# Patient Record
Sex: Female | Born: 1970
Health system: Southern US, Community
[De-identification: ages and names within clinical notes are randomized; demographics above are authoritative.]

## PROBLEM LIST (undated history)

## (undated) DIAGNOSIS — J45909 Unspecified asthma, uncomplicated: Secondary | ICD-10-CM

## (undated) DIAGNOSIS — Z8669 Personal history of other diseases of the nervous system and sense organs: Secondary | ICD-10-CM

## (undated) DIAGNOSIS — G4733 Obstructive sleep apnea (adult) (pediatric): Secondary | ICD-10-CM

## (undated) DIAGNOSIS — Z8679 Personal history of other diseases of the circulatory system: Secondary | ICD-10-CM

## (undated) DIAGNOSIS — I509 Heart failure, unspecified: Secondary | ICD-10-CM

## (undated) DIAGNOSIS — K219 Gastro-esophageal reflux disease without esophagitis: Secondary | ICD-10-CM

## (undated) DIAGNOSIS — I10 Essential (primary) hypertension: Secondary | ICD-10-CM

## (undated) DIAGNOSIS — M797 Fibromyalgia: Secondary | ICD-10-CM

## (undated) HISTORY — PX: LIPOSUCTION: SHX10

## (undated) HISTORY — PX: HUMERUS FRACTURE SURGERY: SHX670

## (undated) HISTORY — PX: OTHER SURGICAL HISTORY: SHX169

## (undated) HISTORY — PX: TONSILLECTOMY AND ADENOIDECTOMY: SUR1326

## (undated) HISTORY — DX: Unspecified asthma, uncomplicated: J45.909

## (undated) HISTORY — PX: TYMPANOTOMY: SHX2588

## (undated) HISTORY — DX: Personal history of other diseases of the circulatory system: Z86.79

## (undated) HISTORY — DX: Essential (primary) hypertension: I10

## (undated) HISTORY — DX: Fibromyalgia: M79.7

## (undated) HISTORY — DX: Obstructive sleep apnea (adult) (pediatric): G47.33

## (undated) HISTORY — DX: Heart failure, unspecified: I50.9

---

## 1998-06-22 ENCOUNTER — Other Ambulatory Visit: Admission: RE | Admit: 1998-06-22 | Discharge: 1998-06-22 | Payer: Self-pay | Admitting: Obstetrics and Gynecology

## 1998-08-18 ENCOUNTER — Ambulatory Visit (HOSPITAL_COMMUNITY): Admission: RE | Admit: 1998-08-18 | Discharge: 1998-08-18 | Payer: Self-pay | Admitting: Obstetrics and Gynecology

## 1998-08-18 ENCOUNTER — Encounter: Payer: Self-pay | Admitting: Obstetrics and Gynecology

## 1999-05-23 ENCOUNTER — Ambulatory Visit (HOSPITAL_COMMUNITY): Admission: RE | Admit: 1999-05-23 | Discharge: 1999-05-23 | Payer: Self-pay | Admitting: Obstetrics and Gynecology

## 1999-05-23 ENCOUNTER — Encounter: Payer: Self-pay | Admitting: Obstetrics and Gynecology

## 1999-12-01 ENCOUNTER — Other Ambulatory Visit: Admission: RE | Admit: 1999-12-01 | Discharge: 1999-12-01 | Payer: Self-pay | Admitting: Obstetrics and Gynecology

## 2000-01-06 ENCOUNTER — Ambulatory Visit (HOSPITAL_COMMUNITY): Admission: RE | Admit: 2000-01-06 | Discharge: 2000-01-06 | Payer: Self-pay | Admitting: Obstetrics and Gynecology

## 2000-11-02 ENCOUNTER — Other Ambulatory Visit: Admission: RE | Admit: 2000-11-02 | Discharge: 2000-11-02 | Payer: Self-pay | Admitting: Obstetrics and Gynecology

## 2001-01-16 ENCOUNTER — Encounter: Payer: Self-pay | Admitting: Emergency Medicine

## 2001-01-16 ENCOUNTER — Emergency Department (HOSPITAL_COMMUNITY): Admission: EM | Admit: 2001-01-16 | Discharge: 2001-01-16 | Payer: Self-pay | Admitting: Emergency Medicine

## 2001-01-17 ENCOUNTER — Encounter: Payer: Self-pay | Admitting: Emergency Medicine

## 2001-12-18 ENCOUNTER — Other Ambulatory Visit: Admission: RE | Admit: 2001-12-18 | Discharge: 2001-12-18 | Payer: Self-pay | Admitting: Obstetrics and Gynecology

## 2002-10-03 ENCOUNTER — Ambulatory Visit (HOSPITAL_COMMUNITY): Admission: RE | Admit: 2002-10-03 | Discharge: 2002-10-03 | Payer: Self-pay | Admitting: Gastroenterology

## 2002-10-03 ENCOUNTER — Encounter (INDEPENDENT_AMBULATORY_CARE_PROVIDER_SITE_OTHER): Payer: Self-pay | Admitting: Specialist

## 2002-10-10 ENCOUNTER — Ambulatory Visit (HOSPITAL_COMMUNITY): Admission: RE | Admit: 2002-10-10 | Discharge: 2002-10-10 | Payer: Self-pay | Admitting: Gastroenterology

## 2002-10-10 ENCOUNTER — Encounter: Payer: Self-pay | Admitting: Gastroenterology

## 2002-10-17 ENCOUNTER — Encounter: Payer: Self-pay | Admitting: Gastroenterology

## 2002-10-17 ENCOUNTER — Encounter: Admission: RE | Admit: 2002-10-17 | Discharge: 2002-10-17 | Payer: Self-pay | Admitting: Gastroenterology

## 2003-01-15 ENCOUNTER — Other Ambulatory Visit: Admission: RE | Admit: 2003-01-15 | Discharge: 2003-01-15 | Payer: Self-pay | Admitting: Obstetrics and Gynecology

## 2003-02-06 ENCOUNTER — Ambulatory Visit (HOSPITAL_BASED_OUTPATIENT_CLINIC_OR_DEPARTMENT_OTHER): Admission: RE | Admit: 2003-02-06 | Discharge: 2003-02-06 | Payer: Self-pay | Admitting: Obstetrics and Gynecology

## 2004-03-04 ENCOUNTER — Other Ambulatory Visit: Admission: RE | Admit: 2004-03-04 | Discharge: 2004-03-04 | Payer: Self-pay | Admitting: Obstetrics and Gynecology

## 2004-10-31 ENCOUNTER — Ambulatory Visit: Payer: Self-pay | Admitting: Internal Medicine

## 2004-10-31 ENCOUNTER — Ambulatory Visit (HOSPITAL_BASED_OUTPATIENT_CLINIC_OR_DEPARTMENT_OTHER): Admission: RE | Admit: 2004-10-31 | Discharge: 2004-10-31 | Payer: Self-pay | Admitting: Otolaryngology

## 2005-03-14 ENCOUNTER — Other Ambulatory Visit: Admission: RE | Admit: 2005-03-14 | Discharge: 2005-03-14 | Payer: Self-pay | Admitting: Obstetrics and Gynecology

## 2006-05-17 ENCOUNTER — Other Ambulatory Visit: Admission: RE | Admit: 2006-05-17 | Discharge: 2006-05-17 | Payer: Self-pay | Admitting: Obstetrics and Gynecology

## 2006-05-20 ENCOUNTER — Emergency Department (HOSPITAL_COMMUNITY): Admission: EM | Admit: 2006-05-20 | Discharge: 2006-05-20 | Payer: Self-pay | Admitting: Emergency Medicine

## 2006-11-19 ENCOUNTER — Encounter (INDEPENDENT_AMBULATORY_CARE_PROVIDER_SITE_OTHER): Payer: Self-pay | Admitting: *Deleted

## 2006-11-19 ENCOUNTER — Inpatient Hospital Stay (HOSPITAL_COMMUNITY): Admission: RE | Admit: 2006-11-19 | Discharge: 2006-11-21 | Payer: Self-pay | Admitting: Obstetrics and Gynecology

## 2006-11-24 ENCOUNTER — Observation Stay (HOSPITAL_COMMUNITY): Admission: AD | Admit: 2006-11-24 | Discharge: 2006-11-25 | Payer: Self-pay | Admitting: Obstetrics and Gynecology

## 2009-08-13 ENCOUNTER — Encounter: Admission: RE | Admit: 2009-08-13 | Discharge: 2009-08-13 | Payer: Self-pay | Admitting: Occupational Medicine

## 2010-05-17 ENCOUNTER — Encounter (INDEPENDENT_AMBULATORY_CARE_PROVIDER_SITE_OTHER): Payer: Self-pay | Admitting: Cardiovascular Disease

## 2010-05-17 ENCOUNTER — Ambulatory Visit (HOSPITAL_COMMUNITY): Admission: RE | Admit: 2010-05-17 | Discharge: 2010-05-17 | Payer: Self-pay | Admitting: Cardiovascular Disease

## 2010-06-29 ENCOUNTER — Encounter (INDEPENDENT_AMBULATORY_CARE_PROVIDER_SITE_OTHER): Payer: Self-pay | Admitting: Cardiovascular Disease

## 2010-06-29 ENCOUNTER — Ambulatory Visit (HOSPITAL_COMMUNITY): Admission: RE | Admit: 2010-06-29 | Discharge: 2010-06-29 | Payer: Self-pay | Admitting: Cardiovascular Disease

## 2011-01-20 NOTE — Op Note (Signed)
   NAME:  [name], [name]                         ACCOUNT NO.:  [id number]   MEDICAL RECORD NO.:  [id number]                   PATIENT TYPE:  AMB   LOCATION:  ENDO                                 FACILITY:   PHYSICIAN:  Anselmo Rod, M.D.               DATE OF BIRTH:  [birth year]   DATE OF PROCEDURE:  10/03/2002  DATE OF DISCHARGE:                                 OPERATIVE REPORT   PROCEDURE:  Colonoscopy with biopsy endoscopy.   INSTRUMENT USED:  Pediatric adjustable Olympus colonoscope.   INDICATIONS FOR PROCEDURE:  Ongoing diarrhea with mucoid stool, right lower  quadrant pain and guaiac positive stools.  A 40 year old white female rule  out IBD.   PROCEDURE PREPARATION:  Informed consent was secured from the patient.  The  patient had fasted for eight hours prior to the procedure and prepped with a  bottle of MiraLax the night prior to the procedure.   PREPROCEDURE PHYSICAL:  The patient has stable vital signs.  Neck supple.  Chest clear to auscultation.  Heart S1, S2, regular.  Abdomen soft with  normal bowel sounds.   DESCRIPTION OF PROCEDURE:  The patient was placed in the left lateral  decubitus position and sedated with 70 mg of Demerol and 8 mg of Versed  intravenously.  Once the patient was adequately sedated she was maintained  on low-flow oxygen and continuous cardiac monitoring.  The Olympus video  colonoscope was advanced from the rectum to the cecum and terminal ileum  without difficulty.  The patient had a fairly good prep.  No masses, polyps,  erosions, ulcerations or diverticula were seen.  Retroflexion in the rectum  revealed no acute abnormalities.  There was patchy erosion seen in the  terminal ileum.  These were biopsied to rule out Crohn's.   IMPRESSION:  1. Healthy appearing colon including rectum, left colon, right colon,     transverse colon and cecum.  2. Multiple erosions in the terminal ileum, biopsies to rule out Crohn's     disease.   RECOMMENDATIONS:  1. Await pathology results.  Avoid all nonsteroidals including aspirin for     now.  2. Outpatient followup in the next one week for further recommendation.                                               Anselmo Rod, M.D.    JNM/MEDQ  D:  10/03/2002  T:  10/04/2002  Job:  [id number]

## 2011-01-20 NOTE — Discharge Summary (Signed)
NAMEDENYS, LABREE NO.:  [id number]   MEDICAL RECORD NO.:  [id number]          PATIENT TYPE:  OBV   LOCATION:  9317                          FACILITY:  [facility]   PHYSICIAN:  Malachi Pro. Ambrose Mantle, M.D. DATE OF BIRTH:  1971/04/09   DATE OF ADMISSION:  11/24/2006  DATE OF DISCHARGE:  11/25/2006                               DISCHARGE SUMMARY   This is a 40 year old white female 5 days status post C-section for  breech presentation seen in the emergency room with heavy vaginal  bleeding. The patient's history and physical exam are present in the  history and physical.  She was admitted after manually expressing clots  from the uterus and giving her Hemabate, Pitocin and Methergine. After  admission the patient remained afebrile.  Vital signs were stable.  She  tolerated ambulation without difficulty, tolerated a regular diet, had  no more significant bleeding and on the first day after admission was  ready for discharge. Prior to discharge I did massage her uterus to see  if I could express any clots. I examined her vagina.  There were no  clots in the vagina.  Cervix was a fingertip dilated and I could feel  some clot behind the cervix but only being able to put one finger in,  was again unable to remove the clots. The patient is ambulating well.  She has no significant bleeding and she is ready for discharge.   LABORATORY DATA ON ADMISSION:  Hemoglobin 9.5, hematocrit 27.7, white  count 9400, platelet count 361,000.  On today's hemoglobin is 7.5,  hematocrit 22.2, white count 6700, platelet count 349,000.   I feel that the drop in hemoglobin is just equilibration after the blood  loss she experienced 24 hours prior. She has had no significant bleeding  since then and my examination confirms that there is no blood in the  vagina and no additional blood inside the uterus.   FINAL DIAGNOSES:  Postpartum hemorrhage.   OPERATION:  None.   FINAL CONDITION:  Improved.   INSTRUCTIONS:  Include the same as on our printed instructions at the  time of her discharge earlier. Call with any heavy bleeding or any  significant problems. Methergine 0.2 mg p.o. q.6 hours for 3 days.  Continue the Percocet she was given at discharge as required and return  to the office in about a week for follow-up examination.      Malachi Pro. Ambrose Mantle, M.D.  Electronically Signed     TFH/MEDQ  D:  11/25/2006  T:  11/25/2006  Job:  [id number]

## 2011-01-20 NOTE — Procedures (Signed)
NAME:  [name], [name] NO.:  [id number]   MEDICAL RECORD NO.:  [id number]          PATIENT TYPE:  OUT   LOCATION:  SLEEP CENTER                 FACILITY:  [facility]   PHYSICIAN:  Clinton D. Maple Hudson, M.D. DATE OF BIRTH:  [birth year]   DATE OF STUDY:  10/31/2004                              NOCTURNAL POLYSOMNOGRAM   DATE OF STUDY:  October 31, 2004   REFERRING PHYSICIAN:  Dr. Lucky Cowboy   INDICATION FOR STUDY:  Insomnia with sleep apnea.  Epworth Sleepiness Score  7/24, BMI 29, weight 164 pounds.   SLEEP ARCHITECTURE:  Total sleep time 365 minutes with sleep efficiency 74%.  Stage I was 13%, stage II 49%, stages III and IV 12%, REM was 27% of total  sleep time.  Sleep latency 27 minutes, REM latency 89 minutes, awake after  sleep onset 105 minutes, arousal index 27.9.  She used Breathe Right nasal  strips but no sleep medications.   RESPIRATORY DATA:  Split-study protocol.  Respiratory disturbance index  (RDI) 18.5 obstructive events per hour indicating mild to moderate  obstructive sleep apnea/hypopnea syndrome before CPAP.  This included 1  obstructive apnea and 44 hypopneas before CPAP.  Events were not positional.  REM RDI 14.1.  CPAP was titrated to 6 CWP, RDI 3.8 per hour using a petite  Respironics ComfortGel Nasal Mask with heated humidifier.   OXYGEN DATA:  Moderate snoring with oxygen desaturation to a nadir of 87%.  Mean oxygen saturation through the study was 98%, including on CPAP.   CARDIAC DATA:  Normal sinus rhythm with frequent PVCs.   MOVEMENT/PARASOMNIA:  A total of 93 limb jerks were recorded of which 7 were  associated with arousal or awakening for a periodic limb movement with  arousal index of 1.2 per hour which is probably not significant.   IMPRESSION/RECOMMENDATION:  1.  Mild to moderate obstructive sleep apnea/hypopnea syndrome, respiratory      disturbance index 18.5 per hour with oxygen desaturation to 87%.  2.  Successful continuous  positive airway pressure titration to 6 CWP,      respiratory disturbance index 3.8 per hour using a petite Respironics      ComfortGel Nasal Mask with heated humidifier and Breathe Right nasal      strips.      CDY/MEDQ  D:  11/06/2004 12:02:20  T:  11/06/2004 18:54:19  Job:  [id number]

## 2011-01-20 NOTE — H&P (Signed)
[name], [name] NO.:  [id number]   MEDICAL RECORD NO.:  [id number]          PATIENT TYPE:  OBV   LOCATION:  9317                          FACILITY:  [facility]   PHYSICIAN:  Malachi Pro. Ambrose Mantle, M.D. DATE OF BIRTH:  Nov 25, 1970   DATE OF ADMISSION:  11/24/2006  DATE OF DISCHARGE:                              HISTORY & PHYSICAL   This is a 40 year old white female five days status post C-section seen  with heavy vaginal bleeding. The patient had a C-section for breech  presentation on November 19, 2006. The cervix never became more than a  fingertip dilated. She was discharged on the second postop day and came  to our office for staple removal on November 22, 2006. After having no  significant problems on the a.m. of admission she passed a huge clot  and then another and soaked several towels.  She came here for  evaluation.   ALLERGIES:  No known drug allergies. She is allergic to CONTRAST  MATERIAL WITH IODINE.   ILLNESSES:  Asthma that did not require hospitalization. She has had  sleep apnea.   OPERATIONS:  Laparoscopic surgery x2 for endometriosis, tonsillectomy  and adenoidectomy, liposuction, and a pin in her arm.   PHYSICAL EXAMINATION:  VITAL SIGNS:  On admission her vital signs varied  somewhat. Initially the pulse was 129 with a blood pressure 121/91, but  subsequent pulses were 108, 91, and 102. All blood pressures remained in  the normal range. Respirations 20, temperature 98.  GENERAL:  The patient was a well-developed, somewhat obese white female,  apprehensive but in no other distress.  HEART:  Normal size and sounds. No murmurs.  LUNGS:  Clear to auscultation.  ABDOMEN:  Soft. The Steri-Strips were stained with blood, but the  incision itself was not bleeding.  LUNGS:  Clear to auscultation.  GENITOURINARY:  Uterus was firm but with palpation blood clots were  expressed from the vagina. Speculum exam reveals some bleeding through  the cervical os.  The cervix admitted only one finger and some clots  could still be felt in the uterus, but with only one finger I could not  remove them. The patient was placed on an IV with Pitocin. Was given an  injection of Hemabate and with massage and the Pitocin and Hemabate the  uterus contracted well and the bleeding diminished. We did measure 100  cc of blood clot that were evacuated. The patient states that she passed  the same amount of clot at home on two occasions and also saturated five  towels.   ADMITTING IMPRESSION:  Postpartum hemorrhage. The patient is admitted  for observation and to be given IV Pitocin, Mepergan, and to be closely  monitored to see if surgical intervention is necessary.      Malachi Pro. Ambrose Mantle, M.D.  Electronically Signed     TFH/MEDQ  D:  11/24/2006  T:  11/24/2006  Job:  [id number]

## 2011-01-20 NOTE — Op Note (Signed)
NAME:  [name], [name]                         ACCOUNT NO.:  [id number]   MEDICAL RECORD NO.:  [id number]                   PATIENT TYPE:  AMB   LOCATION:  [facility]                                 FACILITY:  [facility]   PHYSICIAN:  Daniel L. Eda Paschal, M.D.           DATE OF BIRTH:  [birth year]   DATE OF PROCEDURE:  02/06/2003  DATE OF DISCHARGE:                                 OPERATIVE REPORT   PREOPERATIVE DIAGNOSES:  Pelvic pain, dyspareunia, endometriosis strongly  suspected.   POSTOPERATIVE DIAGNOSES:  Pelvic pain, dyspareunia, endometriosis.   OPERATION:  Laparoscopy with laser excision of endometriosis.   SURGEON:  Daniel L. Eda Paschal, M.D.   ANESTHESIA:  General endotracheal.   INDICATIONS FOR PROCEDURE:  The patient is a 40 year old female who  presented to the office with right lower quadrant pain. She was also having  generalized pelvic pain, she is having severe dyspareunia. She is having  bleeding with intercourse. She has a family history of endometriosis and as  a result of all the above, she now enters the hospital for laparoscopy and  appropriate surgery. She has been evaluated by Dr. Elsie Amis who thought she  might have inflammatory bowel disease but after a thorough evaluation was  diagnosed with just irritable bowel.   FINDINGS:  At the time of the surgery, the patient had two areas of  extensive endometriosis, one was in the cul-de-sac between the two  uterosacral ligaments. It consisted of mostly nonpigment although there was  also some pigmented endometriosis. There was also some vascular pigmented  endometriosis. Total area involved was about 4-5 cm. It was somewhere  between superficial and deep. The right ovary and fallopian tube were  normal. There were no adhesions involving the right ovary or tubes, the  fimbria on the right were luxuriant. The left ovary had extensive  endometriosis as well. It was mostly nonpigmented, it involved some surface  ovarian  adhesions although the ovary was not adherent to surrounding  structures. Total area involved was approximately 4 cm. This was all  superficial. There were also two very small simple cysts that drained clear  fluid when they were lasered. The left fallopian tube was normal with  luxuriant fimbria. The uterus had a very small fibroid of about 1 1/2 cm on  the top of the fundus which was subserosal. The vesicouterine fold to  peritoneum was free of any disease, there was no other adhesive diseases  except as described above. The ileocecal junction was identified, appendix  was normal. The right upper quadrant was visualized and was normal. Indigo  carmine was introduced and both tubes were patent without fimbrial bridging.   DESCRIPTION OF PROCEDURE:  After adequate general endotracheal anesthesia,  the patient was placed in the dorsal lithotomy position, prepped and draped  in the usual sterile manner. A Hulka catheter was placed into the uterus, a  pneumoperitoneum was created with a Veress needle,  placed subumbilically. 3  1/2 liters of carbon dioxide were utilized to create a pneumoperitoneum. The  10 mm trocar was then placed through the subumbilical incision and the  operating laparoscope was placed through the trocar. Under direct vision, a  5 mm port was placed suprapubically, the bladder had been emptied with a  Robinson catheter prior to any operative procedure. The pelvis was  visualized which is noted above. Using a neodymium YAG laser with a frosted  GR4 tip over the bare fiber at power settings of 4, all endometriosis could  be laser vaporized and excised. Before this was done, care was taken to  identify both ureters so that they would not be involved in dissection. The  entire cul-de-sac area, the entire left ovary area were all completely  excised without in any way compromising the ureter. It was a very large  vessel in the midline in the cul-de-sac that was encountered  during the  beginning of the dissection and bipolar coagulation was utilized in order to  stop the bleeding but this was successful. Total time of laser was probably  40 minutes. When this had been completed, copious irrigation was done with  Ringer's lactate and all cul-de-sac fluid was removed. Indigo carmine was  chemotubated through the cervix and both tubes were patent. All cul-de-sac  fluid was removed, pneumoperitoneum was evacuated. The subumbilical fascial  incision was closed with #0 Vicryl and the two skin incisions closed with 3-  0 Monocryl. The patient tolerated the procedure well and left the operating  room in satisfactory condition.                                               Daniel L. Eda Paschal, M.D.    Tonette Bihari  D:  02/06/2003  T:  02/06/2003  Job:  [id number]

## 2011-01-20 NOTE — Op Note (Signed)
[facility]  Patient:    [name], [name]                      MRN: [id number] Proc. Date: 01/06/00 Adm. Date:  [id number] Disc. Date: [id number] Attending:  Michaele Offer CC:         Morrison Old, M.D., [facility]                           Operative Report  PREOPERATIVE DIAGNOSES:  Pelvic pain, dyspareunia.  POSTOPERATIVE DIAGNOSES:  Pelvic pain, dyspareunia, pelvic adhesion and uterine leiomyoma.  OPERATION: Laparoscopic adhesiolysis and chromopertubation. SURGEON: Zenaida Niece, M.D.  ANESTHESIA: General endotracheal tube.  ESTIMATED BLOOD LOSS: Less than 50 cc.  FINDINGS:  She has a normal-appearing liver edge, gallbladder, upper abdomen, and appendix.  Uterus was normal in size and had a small fundal myoma. Anterior cul-de-sac was normal.  Both tubes and ovaries appeared normal. Posterior cul-de-sac was normal except for one peritoneal followed/adhesion. There was no evidence of endometriosis or significant pelvic adhesions.  On chromopertubation both fallopian tubes were easily patent and the fimbria of both tubes were very mildly clubbed but no significant abnormalities.  COUNTS: Correct.  CONDITION: Stable.  DESCRIPTION OF PROCEDURE:  After appropriate informed consent was obtained, the patient was taken to the operating room and placed in the dorsal supine position.  General anesthesia was induced and she was placed in the mobile stirrups.  Her abdomen was prepped and draped in the usual sterile fashion for laparoscopic procedure, her bladder was drained with a red rubber catheter, and a hemiuterine manipulator was inserted through the cervix into the uterus for uterine manipulation.  Her infraumbilical skin was then infiltrated with 0.25% Marcaine and a 1.5 cm horizontal incision was made.  The 10/11 disposable trocar was then introduced and placement confirmed by the laparoscope after CO2 gas was insufflated.   A 5 mm port was placed in the midline 3 cm above the pubis symphysis under direct visualization.  Inspection revealed the above-mentioned findings.  The adhesion in the posterior cul-de-sac was cut sharply and bleeding controlled with bipolar cautery. Prior to this chromopertubation had been performed with methylene blue through the hemiuterine manipulator.  Both tubes were easily patent.  The pelvis was irrigated and all irrigant was suctioned out.  The adhesion site of the posterior cul-de-sac was found to have adequate hemostasis.  At this point there was no other evidence of adhesions and no other source for her pelvic pain discovered, so the procedure was terminated.  The 5 mm port was removed under direct visualization.  All gas was allowed to deflate from the abdomen and the 10/11 trocar was then also removed.  The skin incisions were closed with interrupted subcuticular sutures of 4-0 Vicryl.  The hemiuterine manipulator was then removed.  The patient was extubated in the operating room, tolerated the procedure well, and taken to the recovery room in stable condition. DD:  01/06/00 TD:  01/09/00 Job: [id number]

## 2011-01-20 NOTE — H&P (Signed)
NAMEJYLA, [name] NO.:  [id number]   MEDICAL RECORD NO.:  [id number]          PATIENT TYPE:  INP   LOCATION:  9160                          FACILITY:  [facility]   PHYSICIAN:  Malachi Pro. Ambrose Mantle, M.D. DATE OF BIRTH:  10-06-1970   DATE OF ADMISSION:  11/19/2006  DATE OF DISCHARGE:                              HISTORY & PHYSICAL   PRESENT ILLNESS:  A 40 year old white female, para 0-0-1-0, gravida 2,  at 38 weeks and zero days, admitted to the hospital after having  spontaneous rupture of membranes at 4 a.m. with clear fluid.  The  patient came to the hospital and was thought to have a negative  Nitrazine test but then while she was in the maternity admission unit  she had a spontaneous gush of fluid.  She had good fetal movement.  No  vaginal bleeding.  She had a few contractions every 10 minutes.  She had  no history of headaches or blurred vision.  She had some right upper  quadrant pain and had increased swelling.   Blood group and type AB positive with negative antibody.  GC and  Chlamydia negative.  RPR nonreactive.  Rubella immune.  hepatitis B  surface antigen negative.  HIV negative.  Glucola 116.  Group B strep  negative.  Cystic fibrosis negative.   PAST MEDICAL HISTORY:  1. Reveals no known drug allergies.  2. She had a history of asthma without hospitalization.  3. She had sleep apnea.   SURGICAL HISTORY:  1. Laparoscopic surgery for endometriosis x2.  2. Tonsillectomy and adenoidectomy.  3. Liposuction.  4. Left arm was broken and had a pin inserted.   1. She had no past obstetrical history except for one spontaneous      abortion.  2. She had no history of abdomen Pap smears.  3. No history of STDs.    She is married.   She takes Prevacid, Tylenol, and prenatal vitamins.   FAMILY HISTORY:  Positive for diabetes mellitus in the paternal  grandmother.  Positive for hypertension in the paternal grandmother.  Cancer of the lung in the paternal  grandfather.  Thyroid disease in a  sister.   PHYSICAL EXAMINATION:  VITAL SIGNS:  Normal, blood pressure is 156/82,  temperature 98, pulse 88, respirations 20.  GENERAL:  Well-developed, well-nourished, white female in no distress.  HEART:  Normal size, no murmurs.  LUNGS:  Clear to auscultation.  ABDOMEN:  Soft, term size fundus.  Fetal heart tones were in the 130s.  PELVIC:  Per Dr. Emeline Darling exam, the cervix was fingertip, 50%, vertex,  at a minus 2.   IMPRESSION:  Intrauterine pregnancy at 38 weeks with premature rupture  of the membranes.   The patient was admitted for augmentation of labor with Pitocin.  PIH  labs were normal.  I examined the patient at 12:33 p.m. and instead of  finding the baby vertex, the baby was breech, which I confirmed by  ultrasound with the fetal vertex in the right upper quadrant of the  maternal abdomen, the spine along the left side of the maternal abdomen,  so the baby was LST.  Cervix was a fingertip, 70%, the breech was at a  minus 2 to minus 3 station.  I called for a C-section.  There was no  room immediately available, so we scheduled a C-section for 4 p.m. on  November 19, 2006.      Malachi Pro. Ambrose Mantle, M.D.  Electronically Signed     TFH/MEDQ  D:  11/19/2006  T:  11/19/2006  Job:  [id number]

## 2011-01-20 NOTE — Op Note (Signed)
[name], [name] NO.:  [id number]   MEDICAL RECORD NO.:  [id number]          PATIENT TYPE:  INP   LOCATION:  9121                          FACILITY:  [facility]   PHYSICIAN:  Malachi Pro. Ambrose Mantle, M.D. DATE OF BIRTH:  [birth year]   DATE OF PROCEDURE:  11/19/2006  DATE OF DISCHARGE:                               OPERATIVE REPORT   PREOPERATIVE DIAGNOSIS:  Intrauterine pregnancy at 38 weeks, premature  rupture of membranes, breech presentation.   POSTOPERATIVE DIAGNOSIS:  Intrauterine pregnancy at 38 weeks, premature  rupture of membranes, breech presentation with small fibroid on the  fundus of the uterus, on the anterior surface of the uterus near the  fundus and was about 3 cm in diameter.   OPERATION:  Low-transverse cervical C-section.   OPERATOR:  Dr. Ambrose Mantle   ANESTHESIA:  Epidural anesthesia.   DESCRIPTION OF PROCEDURE:  The patient was brought to the operating room  and the epidural anesthetic was boosted. She was placed in left lateral  tilt position.  Fetal heart tones were normal by the monitor. The  monitor was removed. Foley catheter was indwelling. The abdomen was  prepped with Betadine solution and draped as a sterile field.  Anesthesia was confirmed with an Allis clamp pinching the lower abdomen  and a transverse incision was made and carried in layers through the  skin and subcutaneous tissue and fascia. Fascia was incised  transversely, separated from the rectus muscles superiorly and  inferiorly. Rectus muscle was completely adherent in the midline and the  midline was created.  The peritoneum was opened, incision was made  vertically and it was pulled laterally to try to increase exposure.  The  lower uterine segment was inspected and incision was made into the lower  uterine segment into the myometrium with the rest away into the amniotic  sac with my finger. I enlarged the incision transversely with the  bandage scissors.  The breech was  present, delivered the breech through  the incisional opening. Then with some fundal pressure delivered the  baby down to the shoulders, delivered the left arm first the right arm  second. Delivered the baby through the incisional opening. The mouth was  there so I just put my finger in the mouth and delivered the baby  without any difficulty.  The nose and pharynx was suctioned with bulb.  The cord was clamped and the infant was given to the neonatologist who  was in attendance.  It was Dr. Mikle Bosworth, she assigned a 7 pound 13 ounce  female infant Apgars of 9 at 1 and 9 at 5 minutes. The placenta did not  separate immediately so I went ahead and did a partial manual extraction  of the placenta even though the patient had consented to cord blood  collection. Inside of the uterus was inspected and found to be free of  any remaining products of conception. Inspecting the uterine incision  revealed that the right side of the uterine incision had actually  extended downward a little bit but I was able to reach the extent of the  incision, palpated it very  carefully to make sure it was still on the  uterus itself and then closed the uterus in two layers using running  locked suture of 0 Vicryl on the first layer, nonlocking suture of the  same material on the second layer. I started the suture both times in  the right uterine angle to try to be sure that I accomplished complete  hemostasis under good visualization. I then reapproximated the  peritoneum over the incision with a running 3-0 Vicryl, liberally  irrigated the pelvis, had good hemostasis.  Both tubes and ovaries  appeared normal.  The uterus appeared normal except the before mentioned  fibroid. I checked both gutters for blood, liberally irrigated,  confirmed hemostasis again and then closed the abdominal wall with  interrupted sutures of 0 Vicryl on the rectus muscle and peritoneum  where the peritoneum was present, closed the fascia  with two running  sutures of 0 Vicryl.  The subcu tissue with a running 3-0 Vicryl.  Skin  was closed with automatic staples. As I was preparing to place my  dressing on the incision.  I realized that the right side of the skin  was not holding the very well and the staples were not really doing  their job so I redid the right side of the incision with staples trying  to make sure that we had skin edge to skin edge. The patient seemed to  tolerate the procedure well.  Blood loss was estimated at 1000 mL.  Sponge and needle counts were correct.  She was returned to recovery in  satisfactory condition.  The urine was clear and yellow at the end of  the procedure.      Malachi Pro. Ambrose Mantle, M.D.  Electronically Signed     TFH/MEDQ  D:  11/19/2006  T:  11/19/2006  Job:  [id number]

## 2011-01-20 NOTE — Discharge Summary (Signed)
NAMEYMANI, PORCHER NO.:  [id number]   MEDICAL RECORD NO.:  [id number]          PATIENT TYPE:  INP   LOCATION:  9121                          FACILITY:  [facility]   PHYSICIAN:  Malachi Pro. Ambrose Mantle, M.D. DATE OF BIRTH:  [birth year]   DATE OF ADMISSION:  11/19/2006  DATE OF DISCHARGE:  11/21/2006                               DISCHARGE SUMMARY   A 40 year old white female, para 0-0-1-0 gravida 2 at 68 weeks'  gestation admitted to the hospital after having spontaneous rupture of  membranes at home at 4 a.m. with clear fluid.  The patient came to the  hospital, was evaluated in MAU and had spontaneous gush of fluid there.  She was having rare contractions.  She was admitted to the hospital and  placed on Pitocin but subsequent exam found the patient to have a breach  presentation, so she was prepared for C-section.  Blood group and type  AB positive with a negative antibody.  GC and Chlamydia negative, RPR  nonreactive, rubella immune, hepatitis B surface antigen negative, HIV  negative.  Glucola was 116, group B strep negative, cystic fibrosis  negative.  The patient underwent a low transverse cervical C-section for  breech presentation, LST.  She did well postoperatively.  On the second  postop day, she was voiding well, tolerating a regular diet, passing  flatus, had had bowel movements.  Incision was healing well.  Her  abdomen was soft and nontender.  She was ready for discharge.  The baby  was born with a problem with its hips and has been seen by orthopedics  and has been advised a harness for 2 months and then following 2 months,  use the harness at night only.   Laboratory data showed a normal urinalysis.  PIH panel was normal.  Initial hemoglobin was 12.6, white count 9900, hematocrit 36.2, platelet  count 305,000, RPR nonreactive.  Followup hemoglobin 11.1, hematocrit  32.2.  Estimated GFR was greater than 60.   FINAL DIAGNOSES:  1. Intrauterine pregnancy at 38  weeks.  2. Premature rupture of membranes.  3. Breech presentation.  4. Small fibroid on the fundus of the uterus.   OPERATION:  Low transverse cervical C-section.   FINAL CONDITION:  Improved.   INSTRUCTIONS:  Our regular discharge instruction booklet.  The patient  is advised to return to the office in 2 days to have her staples  removed.  Percocet 5/325, 30 tabs 1 q.4-6h. p.r.n. pain is given at  discharge.      Malachi Pro. Ambrose Mantle, M.D.  Electronically Signed     TFH/MEDQ  D:  11/21/2006  T:  11/21/2006  Job:  [id number]

## 2011-04-05 ENCOUNTER — Ambulatory Visit: Payer: Self-pay

## 2011-04-05 ENCOUNTER — Other Ambulatory Visit: Payer: Self-pay | Admitting: Occupational Medicine

## 2011-04-05 DIAGNOSIS — R05 Cough: Secondary | ICD-10-CM

## 2011-06-08 ENCOUNTER — Other Ambulatory Visit: Payer: Self-pay | Admitting: Obstetrics and Gynecology

## 2011-06-08 DIAGNOSIS — Z1231 Encounter for screening mammogram for malignant neoplasm of breast: Secondary | ICD-10-CM

## 2011-06-22 ENCOUNTER — Ambulatory Visit
Admission: RE | Admit: 2011-06-22 | Discharge: 2011-06-22 | Disposition: A | Discharge status: Home / Self Care | Payer: 59 | Source: Ambulatory Visit | Attending: Obstetrics and Gynecology | Admitting: Obstetrics and Gynecology

## 2011-06-22 DIAGNOSIS — Z1231 Encounter for screening mammogram for malignant neoplasm of breast: Secondary | ICD-10-CM

## 2011-08-21 ENCOUNTER — Ambulatory Visit: Payer: Self-pay

## 2011-08-21 ENCOUNTER — Other Ambulatory Visit: Payer: Self-pay | Admitting: Occupational Medicine

## 2012-01-27 IMAGING — CR DG CHEST 2V
2 series · 2 of 2 positions shown · non-contrast
Comparison: None.

CLINICAL DATA: Persistent cough.

CHEST - 2 VIEW

[view not recorded (1 of 2)]
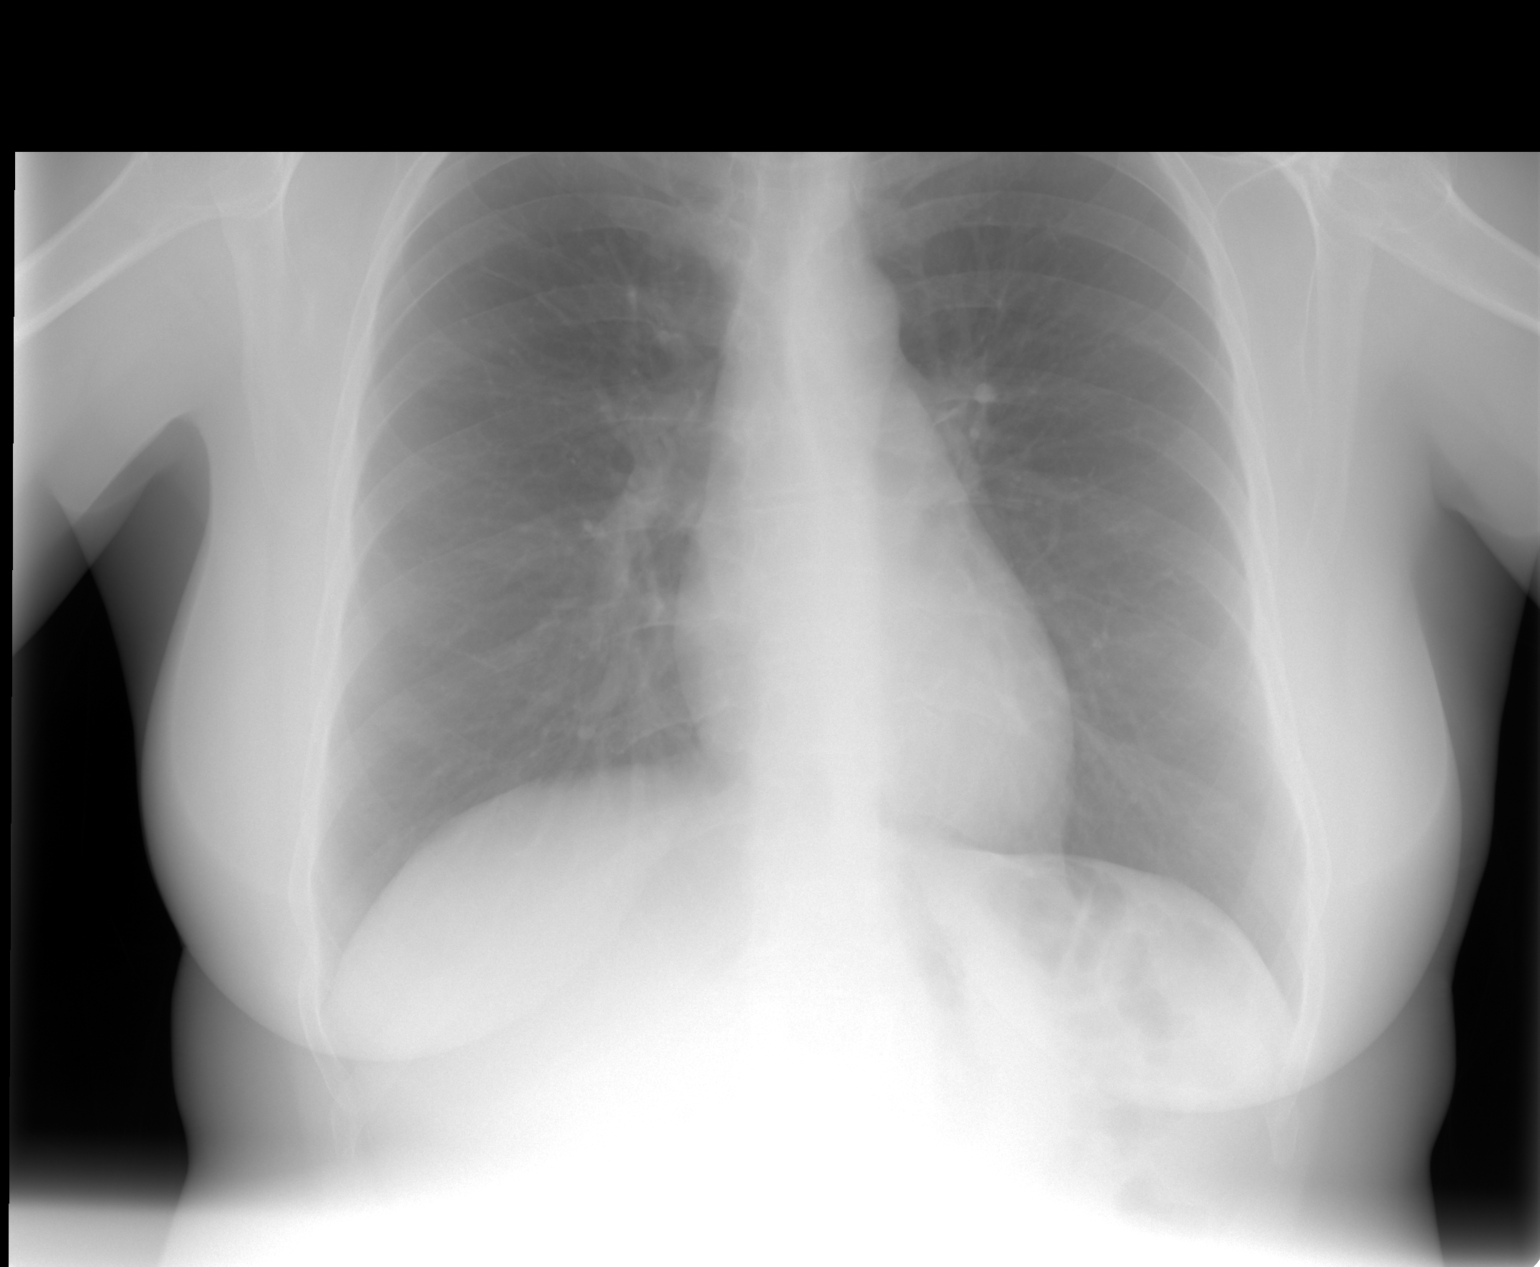

[view not recorded (2 of 2)]
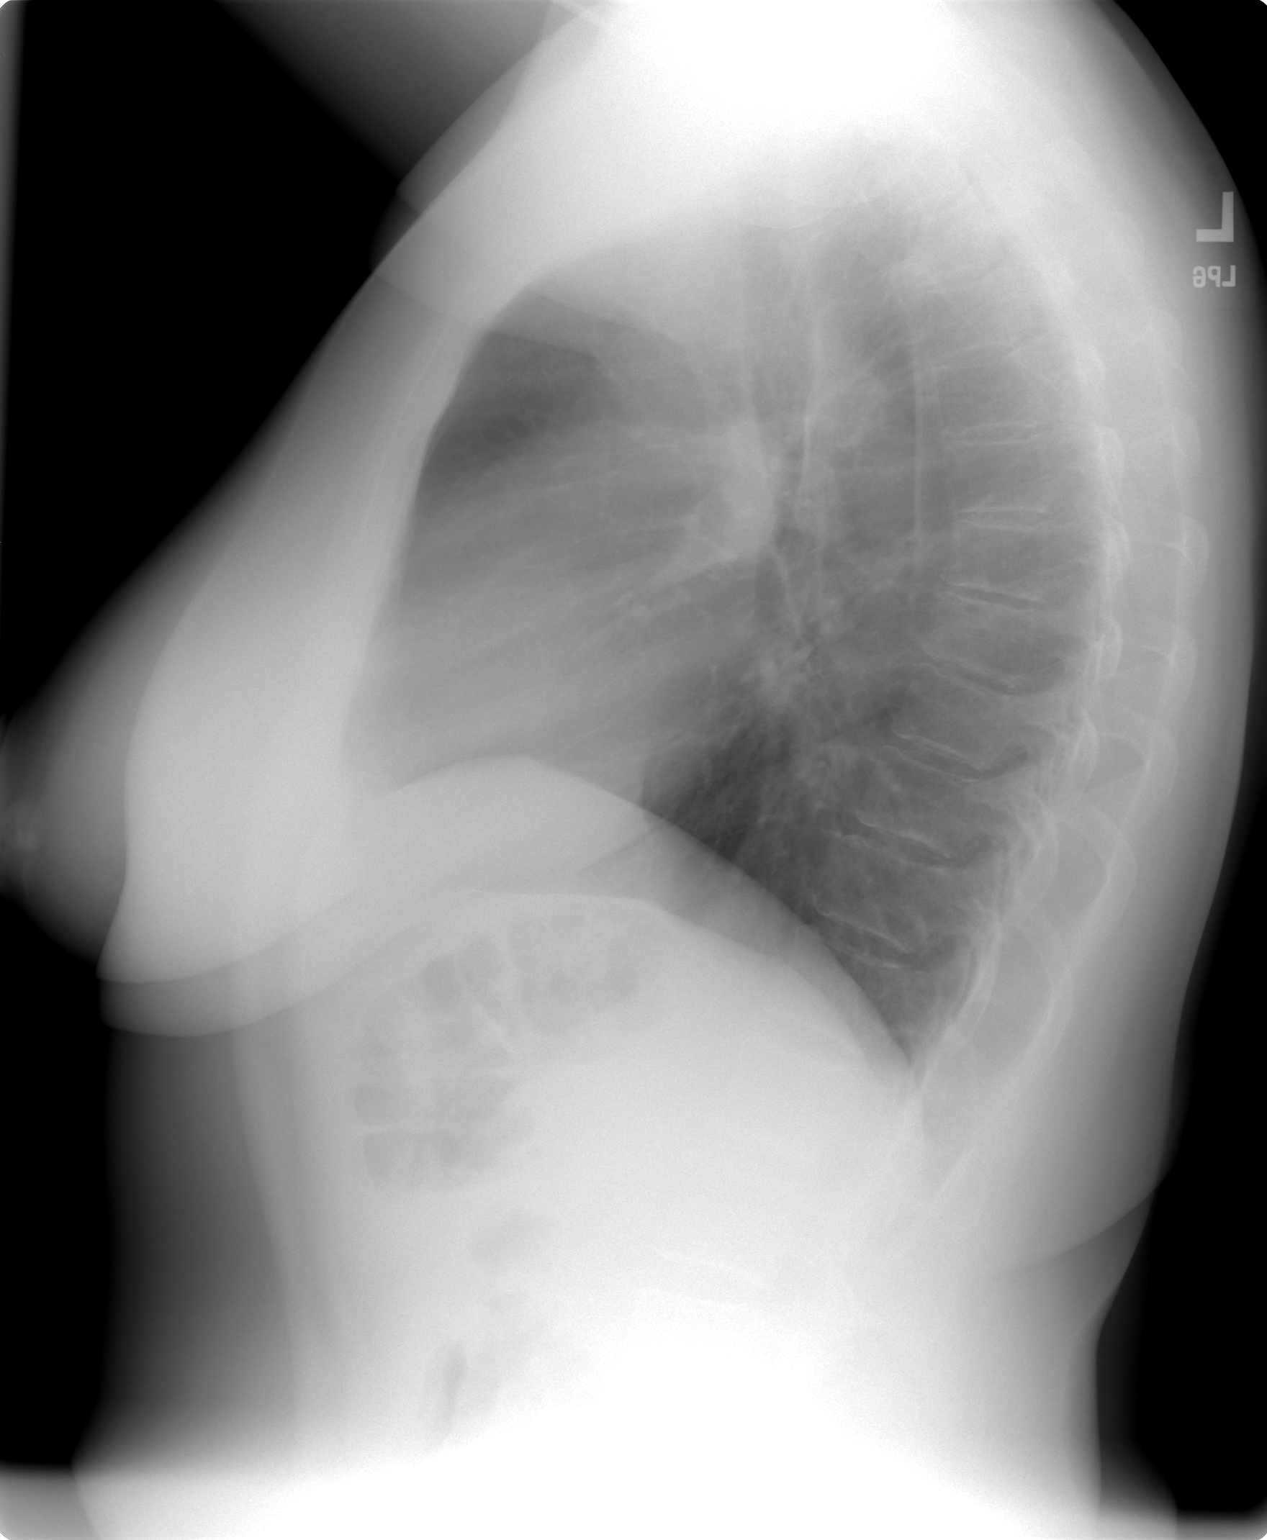

[2 of 2 positions shown; findings below may reference images not displayed]

FINDINGS: Trachea is midline.  Heart size normal.  Lungs are clear.
No pleural fluid.
IMPRESSION: No acute findings.

## 2012-02-16 ENCOUNTER — Other Ambulatory Visit: Payer: Self-pay | Admitting: Occupational Medicine

## 2012-02-16 ENCOUNTER — Ambulatory Visit: Payer: Self-pay

## 2012-02-16 DIAGNOSIS — R0600 Dyspnea, unspecified: Secondary | ICD-10-CM

## 2012-02-23 ENCOUNTER — Encounter: Payer: Self-pay | Admitting: *Deleted

## 2012-02-23 ENCOUNTER — Encounter: Payer: Self-pay | Admitting: Internal Medicine

## 2012-02-23 ENCOUNTER — Ambulatory Visit (INDEPENDENT_AMBULATORY_CARE_PROVIDER_SITE_OTHER): Payer: 59 | Admitting: Internal Medicine

## 2012-02-23 VITALS — BP 122/78 | HR 84 | Temp 98.0°F | Ht 64.0 in | Wt 180.2 lb

## 2012-02-23 DIAGNOSIS — R0600 Dyspnea, unspecified: Secondary | ICD-10-CM | POA: Insufficient documentation

## 2012-02-23 DIAGNOSIS — R0989 Other specified symptoms and signs involving the circulatory and respiratory systems: Secondary | ICD-10-CM

## 2012-02-23 NOTE — Assessment & Plan Note (Addendum)
-   Spirometry 02/23/2012 wnl Symptoms are markedly disproportionate to objective findings and refractory to asthma rx (advair and prednisone) and not clear this is a lung problem but pt does appear to have difficult airway management issues. Most likely this is either panic disorder or a form of  Classic Upper airway cough syndrome, so named because it's frequently impossible to sort out how much is  CR/sinusitis with freq throat clearing (which can be related to primary GERD, which she has)   vs  causing  secondary (" extra esophageal") exacerbation of underlying GERD from wide swings in gastric pressure that occur with throat clearing, often  promoting self use of mint and menthol lozenges that reduce the lower esophageal sphincter tone and exacerbate the problem further in a cyclical fashion.   These are the same pts (now being labeled as having "irritable larynx syndrome" by some cough centers) who not infrequently have a history of having failed to tolerate ace inhibitors,  dry powder inhalers or biphosphonates or report having atypical reflux symptoms that don't respond to standard doses of PPI , and are easily confused as having aecopd or asthma flares by even experienced allergists/ pulmonologists.   For now rec max gerd rx and rx with prn xanax but try as much as possible to minimize saba since may be making this worse

## 2012-02-23 NOTE — Progress Notes (Signed)
  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   02/23/2012 1st pulmonary eval cc acute new pattern sob abupt onset p exposure to glitter 01/21/12 >severe cough and resolved but now 24 h/day sob waxes and wanes and no relation to activity and even wakes her up at night already tried advair and prevacid and no better, no change p proventil, prednisone.   Has baseline HB so takes prevacid 30 mg @ bfast with ok control and denies flare. No assoc sinus symptoms.  Also denies any obvious fluctuation of symptoms with weather or environmental changes or other aggravating or alleviating factors except as outlined above    Review of Systems  Constitutional: Negative for fever, chills and unexpected weight change.  HENT: Negative for ear pain, nosebleeds, congestion, sore throat, rhinorrhea, sneezing, trouble swallowing, dental problem, voice change, postnasal drip and sinus pressure.   Eyes: Negative for visual disturbance.  Respiratory: Positive for shortness of breath. Negative for cough and choking.   Cardiovascular: Negative for chest pain and leg swelling.  Gastrointestinal: Negative for vomiting, abdominal pain and diarrhea.  Genitourinary: Negative for difficulty urinating.  Musculoskeletal: Negative for arthralgias.  Skin: Negative for rash.  Neurological: Positive for headaches. Negative for tremors and syncope.  Hematological: Does not bruise/bleed easily.       Objective:   Physical Exam  amb wf nad  Wt 180 02/23/2012   HEENT: nl dentition, turbinates, and orophanx. Nl external ear canals without cough reflex   NECK :  without JVD/Nodes/TM/ nl carotid  upstrokes bilaterally   LUNGS: no acc muscle use, clear to A and P bilaterally without cough on insp or exp maneuvers   CV:  RRR  no s3 or murmur or increase in P2, no edema   ABD:  soft and nontender with nl excursion in the supine position. No bruits or organomegaly, bowel sounds nl  MS:  warm without deformities, calf tenderness, cyanosis or clubbing  SKIN: warm and dry without lesions    NEURO:  alert, approp, no deficits    cxr 02/16/12  Normal chest        Assessment & Plan:

## 2012-02-23 NOTE — Patient Instructions (Addendum)
GERD (REFLUX)  is an extremely common cause of respiratory symptoms, many times with no significant heartburn at all.    It can be treated with medication, but also with lifestyle changes including avoidance of late meals, excessive alcohol, smoking cessation, and avoid fatty foods, chocolate, peppermint, colas, red wine, and acidic juices such as orange juice.  NO MINT OR MENTHOL PRODUCTS SO NO COUGH DROPS  USE SUGARLESS CANDY INSTEAD (jolley ranchers or Stover's)  NO OIL BASED VITAMINS - use powdered substitutes.  Prevacid 30 mg Take 30- 60 min before your first and last meals of the day perfectly regularly  If still having spells try xanax 0.25 take up 2 every 4 hours - try to avoid using inhalers for this if possible  Please schedule a follow up office visit in 4 weeks, sooner if needed

## 2012-03-11 ENCOUNTER — Telehealth: Payer: Self-pay | Admitting: Internal Medicine

## 2012-03-11 MED ORDER — ALPRAZOLAM 0.25 MG PO TABS: ORAL_TABLET | ORAL | Status: DC

## 2012-03-11 NOTE — Telephone Encounter (Signed)
rx has been called to the pharmacy and lmomtcb to make pt aware.

## 2012-03-11 NOTE — Telephone Encounter (Signed)
Returning call can be reached at [phone].[name]

## 2012-03-11 NOTE — Telephone Encounter (Signed)
Called spoke with patient, informed her that her alprazolam has been sent to [facility].  Pt verbalized her understanding.  Also, pt stated that Dr Sandi Mealy (her PCP) will not do the PA for pt's prevacid to be twice daily since MW changed the dose - working well for pt per her report.  The PA info is to be sent to this office and pt is aware we will contact her once this is taken care of.  Nothing further needed, will sign off.

## 2012-03-14 ENCOUNTER — Telehealth: Payer: Self-pay | Admitting: Internal Medicine

## 2012-03-14 NOTE — Telephone Encounter (Signed)
PA has been placed in MW look at. Pt is aware we are in the process and will keep her updated. Will forward to MW so he is aware

## 2012-03-22 ENCOUNTER — Telehealth: Payer: Self-pay | Admitting: Internal Medicine

## 2012-03-22 MED ORDER — LANSOPRAZOLE 30 MG PO CPDR: 30.0000 mg | DELAYED_RELEASE_CAPSULE | Freq: Every day | ORAL | Status: DC

## 2012-03-22 NOTE — Telephone Encounter (Signed)
rx has been sent to the pharmacy and i have call and spoke with the pt and she is aware of rx sent to her pharmacy.  Nothing further needed.

## 2012-03-22 NOTE — Telephone Encounter (Signed)
Received form stating that med approved for until 03-19-13. Spoke with pt's spouse and notified that this was done and he states will inform the pt.

## 2012-03-22 NOTE — Telephone Encounter (Signed)
Verlon Au, have you seen an approval/denial for this yet?

## 2012-03-25 ENCOUNTER — Telehealth: Payer: Self-pay | Admitting: Internal Medicine

## 2012-03-25 NOTE — Telephone Encounter (Signed)
lmomtcb x1 for pt 

## 2012-03-26 MED ORDER — LANSOPRAZOLE 30 MG PO CPDR: 30.0000 mg | DELAYED_RELEASE_CAPSULE | Freq: Two times a day (BID) | ORAL | Status: DC

## 2012-03-26 NOTE — Telephone Encounter (Signed)
Per OV note pt is to take med as follows: Prevacid 30 mg Take 30- 60 min before your first and last meals of the day perfectly regularly.   Rx corrected. Pt is aware. Carron Curie, CMA

## 2012-03-28 ENCOUNTER — Encounter: Payer: Self-pay | Admitting: Internal Medicine

## 2012-03-28 ENCOUNTER — Ambulatory Visit (INDEPENDENT_AMBULATORY_CARE_PROVIDER_SITE_OTHER): Payer: 59 | Admitting: Internal Medicine

## 2012-03-28 VITALS — BP 116/80 | HR 64 | Temp 98.2°F | Ht 64.0 in | Wt 183.4 lb

## 2012-03-28 DIAGNOSIS — R0600 Dyspnea, unspecified: Secondary | ICD-10-CM

## 2012-03-28 DIAGNOSIS — R0609 Other forms of dyspnea: Secondary | ICD-10-CM

## 2012-03-28 MED ORDER — LANSOPRAZOLE 30 MG PO CPDR: DELAYED_RELEASE_CAPSULE | ORAL | Status: DC

## 2012-03-28 MED ORDER — FAMOTIDINE 20 MG PO TABS: ORAL_TABLET | ORAL | Status: DC

## 2012-03-28 NOTE — Progress Notes (Signed)
  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   02/23/2012 1st pulmonary eval cc acute new pattern sob abupt onset p exposure to glitter 01/21/12 >severe cough and resolved but now 24 h/day sob waxes and wanes and no relation to activity and even wakes her up at night already tried advair and prevacid and no better, no change p proventil, prednisone.   Has baseline HB so takes prevacid 30 mg @ bfast with ok control and denies flare. No assoc sinus symptoms.   rec GERD diet. Prevacid 30 mg Take 30- 60 min before your first and last meals of the day perfectly regularly If still having spells try xanax 0.25 take up 2 every 4 hours - try to avoid using inhalers for this if possible  03/28/2012 f/u ov/Wert cc better to her satisfaction not needing any inhalers while on ppi bid ac but occ needs xanax at hs and  No unusual cough, purulent sputum or sinus/hb symptoms on present rx.  No early am exacerbation  of respiratory  c/o's or need for noct saba. Also denies any obvious fluctuation of symptoms with weather or environmental changes or other aggravating or alleviating factors except as outlined above         Objective:   Physical Exam  amb wf nad still occ throat clearing  Wt 180 02/23/2012 > 03/28/2012  183  HEENT: nl dentition, turbinates, and orophanx. Nl external ear canals without cough reflex   NECK :  without JVD/Nodes/TM/ nl carotid upstrokes bilaterally   LUNGS: no acc muscle use, clear to A and P bilaterally without cough on insp or exp maneuvers   CV:  RRR  no s3 or murmur or increase in P2, no edema   ABD:  soft and nontender with nl excursion in  the supine position. No bruits or organomegaly, bowel sounds nl  MS:  warm without deformities, calf tenderness, cyanosis or clubbing  SKIN: warm and dry without lesions    NEURO:  alert, approp, no deficits    cxr 02/16/12  Normal chest        Assessment & Plan:

## 2012-03-28 NOTE — Patient Instructions (Signed)
Prevacid 30 mg Take 30-60 min before first meal of the day and Pepcid 20 mg one at bedtime at least months and anytime you of your respiratory complaints   If you are satisfied with your treatment plan let your doctor know and he/she can either refill your medications or you can return here when your prescription runs out.     If in any way you are not 100% satisfied,  please tell us.  If 100% better, tell your friends!

## 2012-03-28 NOTE — Assessment & Plan Note (Signed)
Symptoms (except for throat clearing) completely resolved with aggressive rx for gerd and off asthma rx.  Since still having occ hs symptoms rec change ppi to Take 30-60 min before first meal of the day and add pepcid 20 mg qhs  No further pulmonary f/u needed

## 2012-04-17 ENCOUNTER — Other Ambulatory Visit: Payer: Self-pay | Admitting: *Deleted

## 2012-04-17 NOTE — Telephone Encounter (Signed)
Refill request received from [facility]  Alprazolam 0.25mg  #30 Take up to 2 tablets by mouth every 4 hours prn Last OV 03/28/12 No f/u scheduled per MW  Please advise Dr. Sherene Sires if okay to refill and how many refills. Thanks Patient aware via phone message that MW out of office until tomorrow and once we receive approval to fill we will contact her.

## 2012-04-19 NOTE — Telephone Encounter (Signed)
I spoke with pt and she stated she already got this refilled through her pcp and did not need this refilled from [place].

## 2012-04-19 NOTE — Telephone Encounter (Signed)
Ok x one only with further rx per primary

## 2012-04-29 ENCOUNTER — Telehealth: Payer: Self-pay | Admitting: Internal Medicine

## 2012-04-29 DIAGNOSIS — R0600 Dyspnea, unspecified: Secondary | ICD-10-CM

## 2012-04-29 NOTE — Telephone Encounter (Signed)
Called and spoke with patient. Informed her that GI referral has been placed and [facility] will be contacting her to set up appt. Patient verbalized understanding and nothing further needed at this time.

## 2012-04-29 NOTE — Telephone Encounter (Signed)
Pt last seen 03-28-12 by MW-aware of GERD like issues. States she is taking Prevacid BID as Pepcid QHS wasn't working any longer. Also, states having increased SOB this past week; would like to know if she needs referral to GI or another OV here. Please advise. Thanks.

## 2012-04-29 NOTE — Telephone Encounter (Signed)
Needs GI referral asap

## 2012-07-08 ENCOUNTER — Other Ambulatory Visit: Payer: Self-pay | Admitting: Obstetrics and Gynecology

## 2012-07-08 DIAGNOSIS — Z1231 Encounter for screening mammogram for malignant neoplasm of breast: Secondary | ICD-10-CM

## 2012-08-15 ENCOUNTER — Inpatient Hospital Stay: Admission: RE | Admit: 2012-08-15 | Payer: Self-pay | Source: Ambulatory Visit

## 2012-12-09 IMAGING — CR DG CHEST 2V
2 series · 2 of 2 positions shown · non-contrast
Comparison: 08/21/2011

CLINICAL DATA: Short of breath

CHEST - 2 VIEW

[view not recorded (1 of 2)]
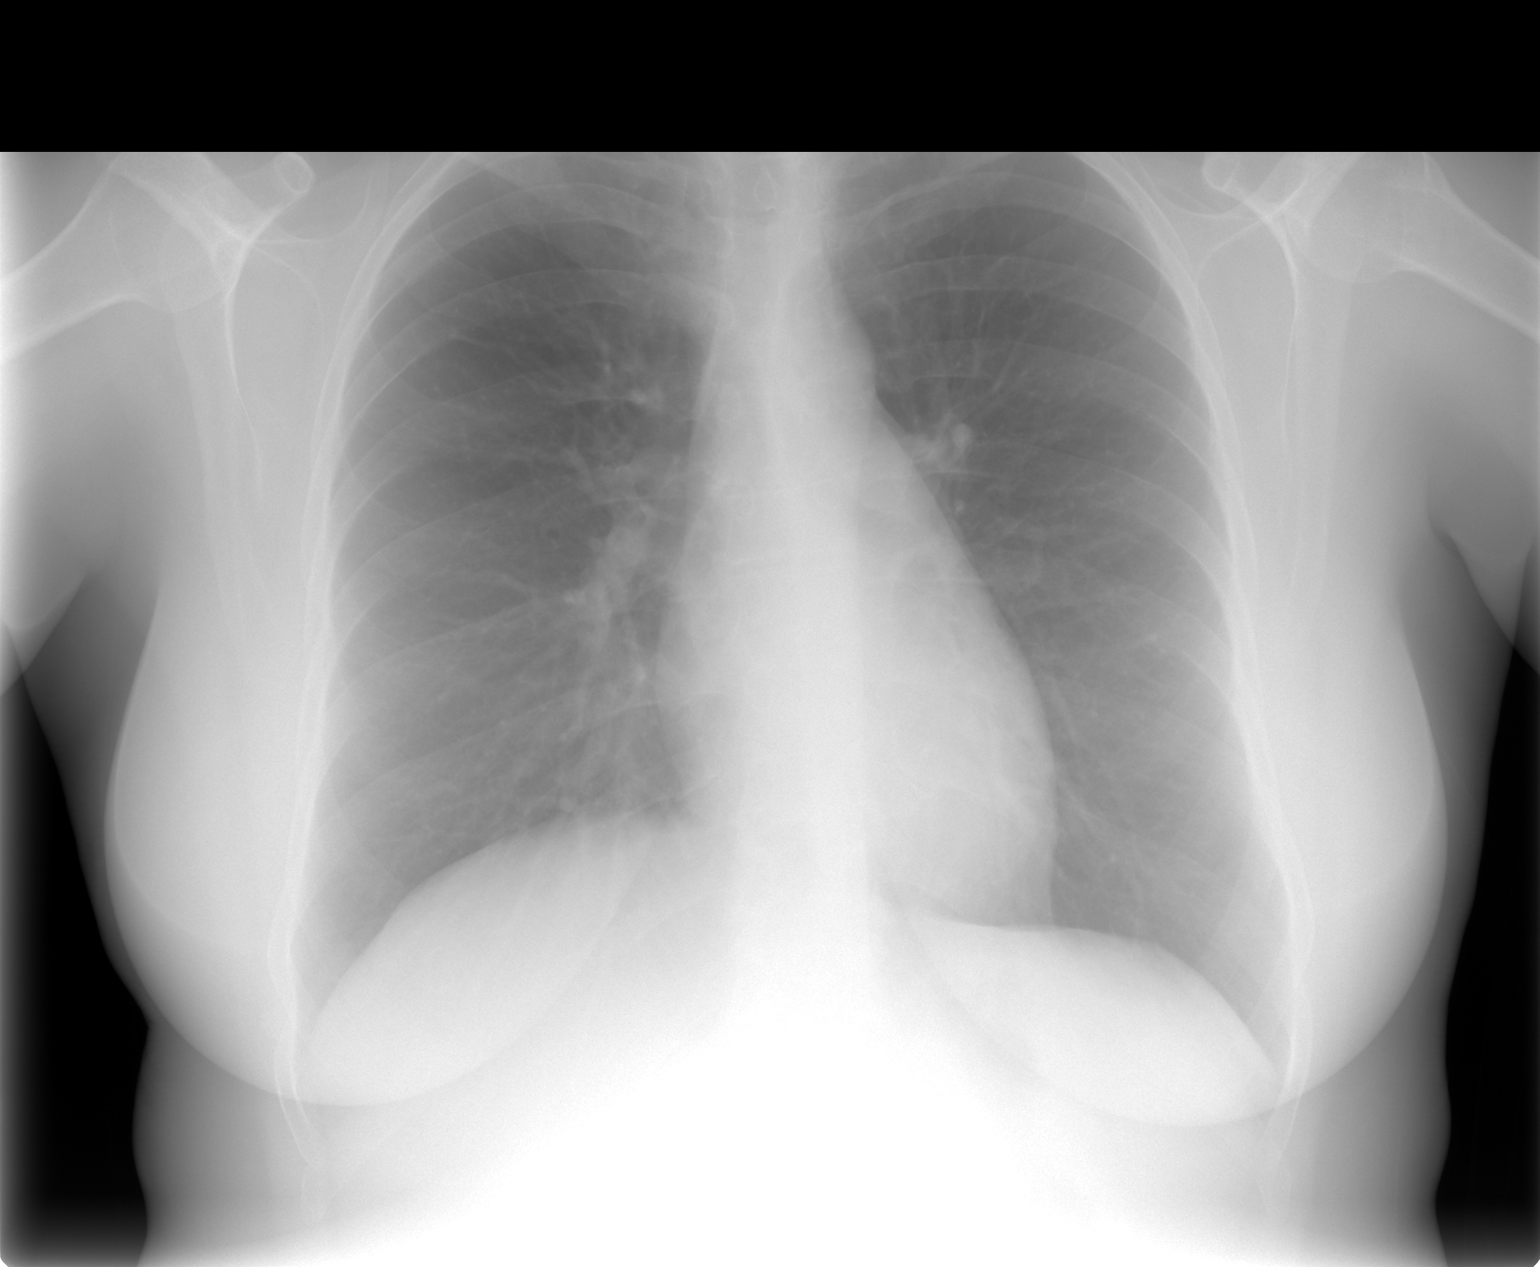

[view not recorded (2 of 2)]
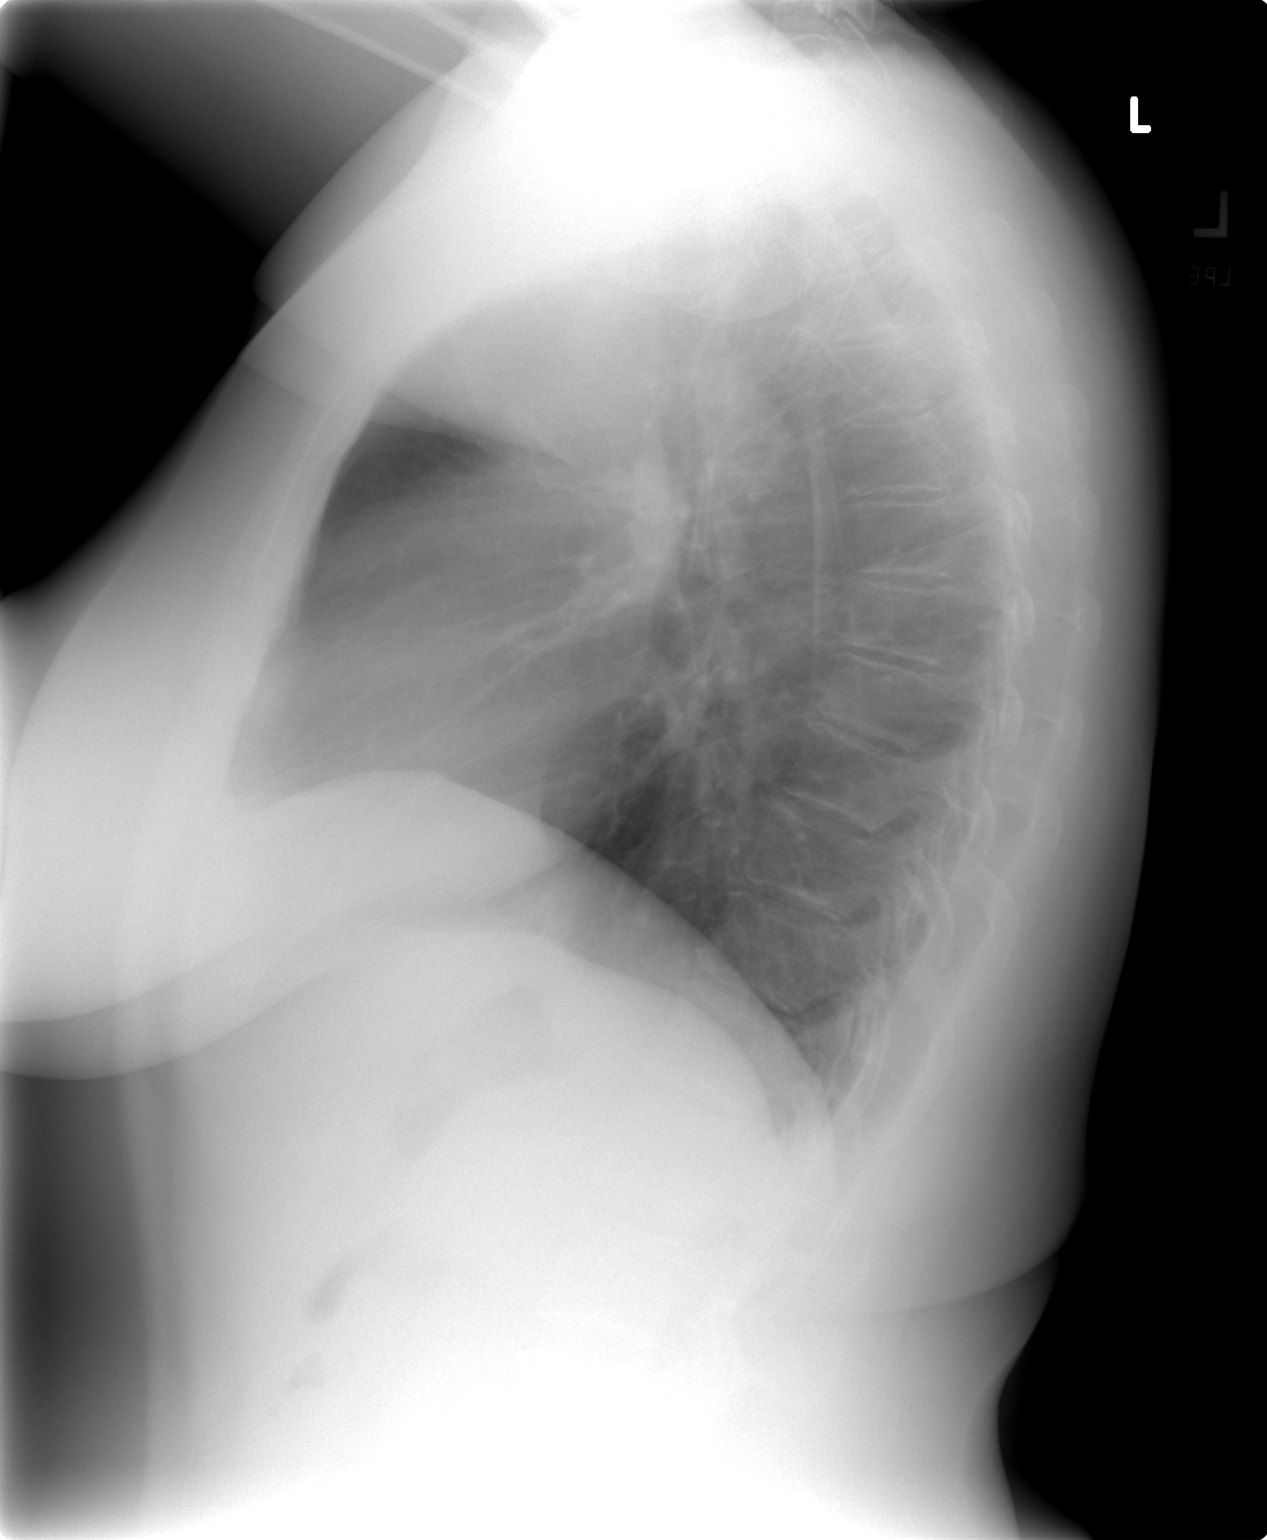

[2 of 2 positions shown; findings below may reference images not displayed]

FINDINGS: Heart size is normal.  Mediastinal shadows are normal.
Lungs are clear.  No effusions.  No bony abnormalities.
IMPRESSION: Normal chest

## 2013-06-25 ENCOUNTER — Institutional Professional Consult (permissible substitution): Payer: Self-pay | Admitting: Neurology

## 2013-06-27 ENCOUNTER — Institutional Professional Consult (permissible substitution): Payer: Self-pay | Admitting: Neurology

## 2014-05-06 ENCOUNTER — Ambulatory Visit: Payer: Self-pay | Admitting: Family Medicine

## 2014-05-08 ENCOUNTER — Encounter: Payer: Self-pay | Admitting: Family Medicine

## 2014-05-08 ENCOUNTER — Ambulatory Visit (INDEPENDENT_AMBULATORY_CARE_PROVIDER_SITE_OTHER): Payer: Self-pay | Admitting: Family Medicine

## 2014-05-08 VITALS — BP 129/73 | HR 74 | Wt 158.0 lb

## 2014-05-08 DIAGNOSIS — G43801 Other migraine, not intractable, with status migrainosus: Secondary | ICD-10-CM

## 2014-05-08 DIAGNOSIS — K2100 Gastro-esophageal reflux disease with esophagitis, without bleeding: Secondary | ICD-10-CM

## 2014-05-08 DIAGNOSIS — F411 Generalized anxiety disorder: Secondary | ICD-10-CM

## 2014-05-08 MED ORDER — PANTOPRAZOLE SODIUM 40 MG PO TBEC
40.0000 mg | DELAYED_RELEASE_TABLET | Freq: Every day | ORAL | Status: DC
Start: 2014-05-08 — End: 2020-06-15

## 2014-05-08 MED ORDER — ZONISAMIDE 100 MG PO CAPS: 100.0000 mg | ORAL_CAPSULE | Freq: Every day | ORAL | Status: DC

## 2014-05-08 MED ORDER — FLUOXETINE HCL 40 MG PO CAPS: 40.0000 mg | ORAL_CAPSULE | Freq: Two times a day (BID) | ORAL | Status: DC

## 2014-05-08 MED ORDER — CARBAMAZEPINE 200 MG PO TABS: 200.0000 mg | ORAL_TABLET | Freq: Two times a day (BID) | ORAL | Status: DC

## 2014-05-08 MED ORDER — ALPRAZOLAM 0.25 MG PO TABS: ORAL_TABLET | ORAL | Status: DC

## 2014-05-08 NOTE — Progress Notes (Signed)
CC: [name] is a 43 y.o. 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 She gets episodes about weekly. She's been treating this with as needed alprazolam when the spasms occur. She's using Prevacid over-the-counter to prevent symptoms.  She denies shortness of breath in any other setting.  Denies chest pain, wheezing, cough, or any other breathing difficulties.  She reports a history of generalized anxiety disorder stemming back many years ago. Since starting Prozac twice a day she's had a significant improvement and resolution of all anxiety symptoms other than when she gets shortness of breath from her laryngeal spasm. She denies any depression, nor any other mental disturbance recently or remotely. She denies thoughts of going to harm her self or others  She reports a history of migraines described as pounding in the left neck and lateral aspect of the head. It occurs one to 2 times a week. He has significantly improved in frequency since starting carbamazepine and Zonegran.  It is accompanied by nausea and photophobia with black floaters in her vision but no other motor or sensory disturbances. She denies any change in the character frequency or severity of her migraines over the past few months.  Review of Systems - General ROS: negative for - chills, fever, night sweats, weight gain or weight loss Ophthalmic ROS: negative for - decreased vision Psychological ROS: negative for - depression ENT ROS: negative for - hearing change, nasal congestion, tinnitus or allergies Hematological and Lymphatic ROS: negative for - bleeding problems, bruising or swollen lymph nodes Breast ROS:  negative Respiratory ROS: no cough, shortness of breath, or wheezing Cardiovascular ROS: no chest pain or dyspnea on exertion Gastrointestinal ROS: no abdominal pain, change in bowel habits, or black or bloody stools Genito-Urinary ROS: negative for - genital discharge, genital ulcers, incontinence or abnormal bleeding from genitals Musculoskeletal ROS: negative for - joint pain or muscle pain Neurological ROS: negative for -  memory loss Dermatological ROS: negative for lumps, mole changes, rash and skin lesion changes  Past Medical History  Diagnosis Date  . CHF (congestive heart failure)   . Asthma   . OSA (obstructive sleep apnea)     No past surgical history on file. Family History  Problem Relation Age of Onset  . Emphysema Father     smoker  . Emphysema Paternal Grandmother     smoked  . Asthma Brother   . Rheum arthritis Paternal Grandmother   . Lung cancer Paternal Grandfather     smoked    History   Social History  . Marital Status: Married    Spouse Name: N/A    Number of Children: 1  . Years of Education: N/A   Occupational History  . RN    Social History Main Topics  . Smoking status: Never Smoker   . Smokeless tobacco: Never Used  . Alcohol Use: Yes     Comment: Socially  . Drug Use: No  . Sexual Activity: Not on file   Other Topics Concern  . Not on file   Social History Narrative  . No narrative on file     Objective: BP 129/73  Pulse 74  Wt 158 lb (71.668 kg)  General:  Alert and Oriented, No Acute Distress HEENT: Pupils equal, round, reactive to light. Conjunctivae clear.  External ears unremarkable, canals clear with intact TMs with appropriate landmarks.  Middle ear appears open without effusion. Pink inferior turbinates.  Moist mucous membranes, pharynx without inflammation nor lesions.  Neck supple without palpable lymphadenopathy nor abnormal masses. Lungs: Clear to auscultation bilaterally, no wheezing/ronchi/rales.  Comfortable work  of breathing. Good air movement. Cardiac: Regular rate and rhythm. Normal S1/S2.  No murmurs, rubs, nor gallops.   Extremities: No peripheral edema.  Strong peripheral pulses.  Mental Status: No depression, anxiety, nor agitation. Skin: Warm and dry.  Assessment & Plan: Shivonne was seen today for establish care.  Diagnoses and associated orders for this visit:  Other migraine with status migrainosus, not intractable - carbamazepine (TEGRETOL) 200 MG tablet; Take 1 tablet (200 mg total) by mouth 2 (two) times daily. - FLUoxetine (PROZAC) 40 MG capsule; Take 1 capsule (40 mg total) by mouth 2 (two) times daily. - zonisamide (ZONEGRAN) 100 MG capsule; Take 1 capsule (100 mg total) by mouth daily.  Gastroesophageal reflux disease with esophagitis - pantoprazole (PROTONIX) 40 MG tablet; Take 1 tablet (40 mg total) by mouth daily. For acid/GERD suppression.  Generalized anxiety disorder - ALPRAZolam (XANAX) 0.25 MG tablet; Take up to 2 tablets by mouth every 4 hours prn. - FLUoxetine (PROZAC) 40 MG capsule; Take 1 capsule (40 mg total) by mouth 2 (two) times daily.     migraines: Controlled continue Tegretol Zonegran and Prozac  GERD: Uncontrolled, stop over-the-counter Prevacid and switch to Protonix, she tells me she may not be able to do this for another month until her insurance kicks in which I think is appropriate and reasonable Generalized anxiety disorder: Controlled on Prozac with as needed alprazolam for laryngospasm induced anxiety  Return if symptoms worsen or fail to improve.

## 2014-05-13 ENCOUNTER — Ambulatory Visit: Payer: Self-pay | Admitting: Family Medicine

## 2014-10-26 ENCOUNTER — Telehealth: Payer: Self-pay | Admitting: *Deleted

## 2014-10-26 DIAGNOSIS — F411 Generalized anxiety disorder: Secondary | ICD-10-CM

## 2014-10-26 DIAGNOSIS — G43801 Other migraine, not intractable, with status migrainosus: Secondary | ICD-10-CM

## 2014-10-26 MED ORDER — CARBAMAZEPINE 200 MG PO TABS: 200.0000 mg | ORAL_TABLET | Freq: Two times a day (BID) | ORAL | Status: DC

## 2014-10-26 MED ORDER — ZONISAMIDE 100 MG PO CAPS
100.0000 mg | ORAL_CAPSULE | Freq: Every day | ORAL | Status: AC
Start: 2014-10-26 — End: ?

## 2014-10-26 MED ORDER — ALPRAZOLAM 0.25 MG PO TABS: ORAL_TABLET | ORAL | Status: DC

## 2014-10-26 MED ORDER — FLUOXETINE HCL 40 MG PO CAPS: 40.0000 mg | ORAL_CAPSULE | Freq: Two times a day (BID) | ORAL | Status: DC

## 2014-10-26 NOTE — Telephone Encounter (Signed)
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

## 2014-11-20 ENCOUNTER — Encounter: Payer: Self-pay | Admitting: Family Medicine

## 2014-11-20 ENCOUNTER — Ambulatory Visit: Payer: Self-pay | Admitting: Family Medicine

## 2014-11-20 ENCOUNTER — Telehealth: Payer: Self-pay | Admitting: *Deleted

## 2014-11-20 MED ORDER — KETOROLAC TROMETHAMINE 60 MG/2ML IM SOLN
60.0000 mg | Freq: Once | INTRAMUSCULAR | Status: AC
  Administered 2014-11-20: 60 mg via INTRAMUSCULAR

## 2014-11-20 NOTE — Progress Notes (Signed)
Encounter was initiated by error, patient was not charged for this visit. She no showed for this visit. Please disregard migraine diagnosis

## 2014-11-20 NOTE — Telephone Encounter (Signed)
Molli Hazard,  I put a toradol injection in on the wrong pt. Can you please make sure she doesn't get charged

## 2014-11-24 NOTE — Telephone Encounter (Signed)
Removed. thanks

## 2015-02-09 DIAGNOSIS — E559 Vitamin D deficiency, unspecified: Secondary | ICD-10-CM | POA: Insufficient documentation

## 2015-09-02 DIAGNOSIS — G479 Sleep disorder, unspecified: Secondary | ICD-10-CM | POA: Insufficient documentation

## 2015-09-02 DIAGNOSIS — M7061 Trochanteric bursitis, right hip: Secondary | ICD-10-CM | POA: Insufficient documentation

## 2015-09-02 DIAGNOSIS — M797 Fibromyalgia: Secondary | ICD-10-CM | POA: Insufficient documentation

## 2015-09-02 DIAGNOSIS — M25551 Pain in right hip: Secondary | ICD-10-CM | POA: Insufficient documentation

## 2015-09-02 DIAGNOSIS — G4733 Obstructive sleep apnea (adult) (pediatric): Secondary | ICD-10-CM | POA: Insufficient documentation

## 2017-08-06 ENCOUNTER — Other Ambulatory Visit: Payer: Self-pay

## 2017-08-06 ENCOUNTER — Encounter (HOSPITAL_COMMUNITY): Payer: Self-pay | Admitting: Emergency Medicine

## 2017-08-06 ENCOUNTER — Emergency Department (HOSPITAL_COMMUNITY): Payer: 59

## 2017-08-06 DIAGNOSIS — R000 Tachycardia, unspecified: Secondary | ICD-10-CM | POA: Insufficient documentation

## 2017-08-06 DIAGNOSIS — R079 Chest pain, unspecified: Secondary | ICD-10-CM | POA: Insufficient documentation

## 2017-08-06 DIAGNOSIS — Z5321 Procedure and treatment not carried out due to patient leaving prior to being seen by health care provider: Secondary | ICD-10-CM | POA: Insufficient documentation

## 2017-08-06 LAB — BASIC METABOLIC PANEL
Anion gap: 10 (ref 5–15)
BUN: 13 mg/dL (ref 6–20)
CO2: 22 mmol/L (ref 22–32)
Calcium: 9.7 mg/dL (ref 8.9–10.3)
Chloride: 107 mmol/L (ref 101–111)
Creatinine, Ser: 0.73 mg/dL (ref 0.44–1.00)
GFR calc Af Amer: >60 mL/min (ref >60)
GFR calc non Af Amer: >60 mL/min (ref >60)
Glucose, Bld: 108 mg/dL — ABNORMAL HIGH (ref 65–99)
Potassium: 4.2 mmol/L (ref 3.5–5.1)
Sodium: 139 mmol/L (ref 135–145)

## 2017-08-06 LAB — CBC
HCT: 41.7 % (ref 36.0–46.0)
Hemoglobin: 14 g/dL (ref 12.0–15.0)
MCH: 31.9 pg (ref 26.0–34.0)
MCHC: 33.6 g/dL (ref 30.0–36.0)
MCV: 95 fL (ref 78.0–100.0)
Platelets: 396 10*3/uL (ref 150–400)
RBC: 4.39 MIL/uL (ref 3.87–5.11)
RDW: 12.7 % (ref 11.5–15.5)
WBC: 8.1 10*3/uL (ref 4.0–10.5)

## 2017-08-06 LAB — I-STAT BETA HCG BLOOD, ED (MC, WL, AP ONLY): I-stat hCG, quantitative: <5 m[IU]/mL (ref <5)

## 2017-08-06 LAB — I-STAT TROPONIN, ED: Troponin i, poc: 0 ng/mL (ref 0.00–0.08)

## 2017-08-07 ENCOUNTER — Emergency Department (HOSPITAL_COMMUNITY)
Admission: EM | Admit: 2017-08-07 | Discharge: 2017-08-07 | Disposition: A | Discharge status: Home / Self Care | Payer: 59 | Attending: Emergency Medicine | Admitting: Emergency Medicine

## 2017-08-07 HISTORY — DX: Gastro-esophageal reflux disease without esophagitis: K21.9

## 2017-08-07 HISTORY — DX: Personal history of other diseases of the nervous system and sense organs: Z86.69

## 2019-10-14 ENCOUNTER — Encounter: Payer: Self-pay | Admitting: Neurology

## 2019-11-25 NOTE — Progress Notes (Deleted)
   NEUROLOGY CONSULTATION NOTE  [name] MRN: [id number] DOB: 09/22/1970  Referring provider: London Pepper, MD Primary care provider: London Pepper, MD  Reason for consult:  Myalgia, fatigue, mental fog,migrianes  HISTORY OF PRESENT ILLNESS: [name] is a 49 year old female with fibromyalgia, anxiety and history of migraines who presents for myalgias, fatigue, migraines and mental fog/difficulty concentrating.  History supplemented by referring provider note.  ***  Labs from January such as CBC, CMP, ESR of 6, folate of 11, and TSH of 1.90 were unremarkable.  Vitamin D level was low at 13.4 and started on supplementation.  B12 was 381.  She carries a diagnosis of fibromyalgia.  She also has anxiety (Xanax) and insomnia and OSA (Ambien PRN; CPAP).  Current NSAIDS:  *** Current analgesics:  *** Current triptans:  Sumatriptan '100mg'$  Current ergotamine:  *** Current anti-emetic:  promethazine Current muscle relaxants:  *** Current anti-anxiolytic:  Xanax Current sleep aide:  Ambien; trazodone Current Antihypertensive medications:  Lasix Current Antidepressant medications:  Cymbalta '30mg'$  daily Current Anticonvulsant medications:  Zonisamide '100mg'$  daily Current anti-CGRP:  *** Current Vitamins/Herbal/Supplements:  *** Current Antihistamines/Decongestants:  *** Other therapy:  *** Hormone/birth control:  *** Other medications:  ***  Past NSAIDS:  *** Past analgesics:  *** Past abortive triptans:  *** Past abortive ergotamine:  *** Past muscle relaxants:  *** Past anti-emetic:  *** Past antihypertensive medications:  *** Past antidepressant medications:  *** Past anticonvulsant medications:  Gabapentin (did not tolerate), Lyrica (did not tolerate) Past anti-CGRP:  *** Past vitamins/Herbal/Supplements:  *** Past antihistamines/decongestants:  *** Other past therapies:  ***  Caffeine:  *** Alcohol:  *** Smoker:  *** Diet:  *** Exercise:  *** Depression:  ***;  Anxiety:  *** Other pain:  *** Sleep hygiene:  *** Family history of headache:  ***   PAST MEDICAL HISTORY: ***  PAST SURGICAL HISTORY: ***  MEDICATIONS: ***  ALLERGIES: ***  FAMILY HISTORY: ***.  SOCIAL HISTORY: ***  PHYSICAL EXAM: *** General: No acute distress.  Patient appears ***-groomed.  *** Head:  Normocephalic/atraumatic Eyes:  fundi examined but not visualized Neck: supple, no paraspinal tenderness, full range of motion Back: No paraspinal tenderness Heart: regular rate and rhythm Lungs: Clear to auscultation bilaterally. Vascular: No carotid bruits. Neurological Exam: Mental status: alert and oriented to person, place, and time, recent and remote memory intact, fund of knowledge intact, attention and concentration intact, speech fluent and not dysarthric, language intact. Cranial nerves: CN I: not tested CN II: pupils equal, round and reactive to light, visual fields intact CN III, IV, VI:  full range of motion, no nystagmus, no ptosis CN V: facial sensation intact CN VII: upper and lower face symmetric CN VIII: hearing intact CN IX, X: gag intact, uvula midline CN XI: sternocleidomastoid and trapezius muscles intact CN XII: tongue midline Bulk & Tone: normal, no fasciculations. Motor:  5/5 throughout *** Sensation:  Pinprick *** temperature *** and vibration sensation intact.  ***. Deep Tendon Reflexes:  2+ throughout, *** toes downgoing.  *** Finger to nose testing:  Without dysmetria.  *** Heel to shin:  Without dysmetria.  *** Gait:  Normal station and stride.  Able to turn and tandem walk. Romberg ***.  IMPRESSION: ***  PLAN: ***  Thank you for allowing me to take part in the care of this patient.  Metta Clines, DO  CC: ***

## 2019-11-26 ENCOUNTER — Ambulatory Visit: Payer: 59 | Admitting: Neurology

## 2020-02-05 ENCOUNTER — Ambulatory Visit: Payer: 59 | Admitting: Neurology

## 2020-03-16 ENCOUNTER — Ambulatory Visit: Payer: Managed Care, Other (non HMO) | Admitting: Cardiology

## 2020-03-16 ENCOUNTER — Encounter: Payer: Self-pay | Admitting: Cardiology

## 2020-03-16 ENCOUNTER — Other Ambulatory Visit: Payer: Self-pay

## 2020-03-16 VITALS — BP 164/90 | HR 90 | Ht 63.0 in | Wt 200.0 lb

## 2020-03-16 DIAGNOSIS — I10 Essential (primary) hypertension: Secondary | ICD-10-CM | POA: Diagnosis not present

## 2020-03-16 NOTE — Patient Instructions (Addendum)
Medication Instructions:  Your physician recommends that you continue on your current medications as directed. Please refer to the Current Medication list given to you today.  *If you need a refill on your cardiac medications before your next appointment, please call your pharmacy*   Lab Work: None ordered If you have labs (blood work) drawn today and your tests are completely normal, you will receive your results only by: Marland Kitchen MyChart Message (if you have MyChart) OR . A paper copy in the mail If you have any lab test that is abnormal or we need to change your treatment, we will call you to review the results.   Testing/Procedures: None ordered   Follow-Up: At [facility], you and your health needs are our priority.  As part of our continuing mission to provide you with exceptional heart care, we have created designated Provider Care Teams.  These Care Teams include your primary Cardiologist (physician) and Advanced Practice Providers (APPs -  Physician Assistants and Nurse Practitioners) who all work together to provide you with the care you need, when you need it.  We recommend signing up for the patient portal called "MyChart".  Sign up information is provided on this After Visit Summary.  MyChart is used to connect with patients for Virtual Visits (Telemedicine).  Patients are able to view lab/test results, encounter notes, upcoming appointments, etc.  Non-urgent messages can be sent to your provider as well.   To learn more about what you can do with MyChart, go to [email or web address].    Your next appointment:   2 week(s)  The format for your next appointment:   In Person  Provider:   [facility]   Your physician recommends that you schedule a follow-up appointment in 2 months with re-establish with general cardiology   Thank you for choosing [facility]!!   Dory Horn, RN [phone]    Other Instructions Keep track of your blood pressures at home over  the next several weeks.    Hypertension, Adult High blood pressure (hypertension) is when the force of blood pumping through the arteries is too strong. The arteries are the blood vessels that carry blood from the heart throughout the body. Hypertension forces the heart to work harder to pump blood and may cause arteries to become narrow or stiff. Untreated or uncontrolled hypertension can cause a heart attack, heart failure, a stroke, kidney disease, and other problems. A blood pressure reading consists of a higher number over a lower number. Ideally, your blood pressure should be below 120/80. The first ("top") number is called the systolic pressure. It is a measure of the pressure in your arteries as your heart beats. The second ("bottom") number is called the diastolic pressure. It is a measure of the pressure in your arteries as the heart relaxes. What are the causes? The exact cause of this condition is not known. There are some conditions that result in or are related to high blood pressure. What increases the risk? Some risk factors for high blood pressure are under your control. The following factors may make you more likely to develop this condition:  Smoking.  Having type 2 diabetes mellitus, high cholesterol, or both.  Not getting enough exercise or physical activity.  Being overweight.  Having too much fat, sugar, calories, or salt (sodium) in your diet.  Drinking too much alcohol. Some risk factors for high blood pressure may be difficult or impossible to change. Some of these factors include:  Having chronic kidney disease.  Having a family history of high blood pressure.  Age. Risk increases with age.  Race. You may be at higher risk if you are African American.  Gender. Men are at higher risk than women before age 55. After age 44, women are at higher risk than men.  Having obstructive sleep apnea.  Stress. What are the signs or symptoms? High blood pressure may  not cause symptoms. Very high blood pressure (hypertensive crisis) may cause:  Headache.  Anxiety.  Shortness of breath.  Nosebleed.  Nausea and vomiting.  Vision changes.  Severe chest pain.  Seizures. How is this diagnosed? This condition is diagnosed by measuring your blood pressure while you are seated, with your arm resting on a flat surface, your legs uncrossed, and your feet flat on the floor. The cuff of the blood pressure monitor will be placed directly against the skin of your upper arm at the level of your heart. It should be measured at least twice using the same arm. Certain conditions can cause a difference in blood pressure between your right and left arms. Certain factors can cause blood pressure readings to be lower or higher than normal for a short period of time:  When your blood pressure is higher when you are in a health care provider's office than when you are at home, this is called white coat hypertension. Most people with this condition do not need medicines.  When your blood pressure is higher at home than when you are in a health care provider's office, this is called masked hypertension. Most people with this condition may need medicines to control blood pressure. If you have a high blood pressure reading during one visit or you have normal blood pressure with other risk factors, you may be asked to:  Return on a different day to have your blood pressure checked again.  Monitor your blood pressure at home for 1 week or longer. If you are diagnosed with hypertension, you may have other blood or imaging tests to help your health care provider understand your overall risk for other conditions. How is this treated? This condition is treated by making healthy lifestyle changes, such as eating healthy foods, exercising more, and reducing your alcohol intake. Your health care provider may prescribe medicine if lifestyle changes are not enough to get your blood  pressure under control, and if:  Your systolic blood pressure is above 130.  Your diastolic blood pressure is above 80. Your personal target blood pressure may vary depending on your medical conditions, your age, and other factors. Follow these instructions at home: Eating and drinking   Eat a diet that is high in fiber and potassium, and low in sodium, added sugar, and fat. An example eating plan is called the DASH (Dietary Approaches to Stop Hypertension) diet. To eat this way: ? Eat plenty of fresh fruits and vegetables. Try to fill one half of your plate at each meal with fruits and vegetables. ? Eat whole grains, such as whole-wheat pasta, brown rice, or whole-grain bread. Fill about one fourth of your plate with whole grains. ? Eat or drink low-fat dairy products, such as skim milk or low-fat yogurt. ? Avoid fatty cuts of meat, processed or cured meats, and poultry with skin. Fill about one fourth of your plate with lean proteins, such as fish, chicken without skin, beans, eggs, or tofu. ? Avoid pre-made and processed foods. These tend to be higher in sodium, added sugar, and fat.  Reduce your daily sodium  intake. Most people with hypertension should eat less than 1,500 mg of sodium a day.  Do not drink alcohol if: ? Your health care provider tells you not to drink. ? You are pregnant, may be pregnant, or are planning to become pregnant.  If you drink alcohol: ? Limit how much you use to:  0-1 drink a day for women.  0-2 drinks a day for men. ? Be aware of how much alcohol is in your drink. In the [place] one drink equals one 12 oz bottle of beer (355 mL), one 5 oz glass of wine (148 mL), or one 1 oz glass of hard liquor (44 mL). Lifestyle   Work with your health care provider to maintain a healthy body weight or to lose weight. Ask what an ideal weight is for you.  Get at least 30 minutes of exercise most days of the week. Activities may include walking, swimming, or  biking.  Include exercise to strengthen your muscles (resistance exercise), such as Pilates or lifting weights, as part of your weekly exercise routine. Try to do these types of exercises for 30 minutes at least 3 days a week.  Do not use any products that contain nicotine or tobacco, such as cigarettes, e-cigarettes, and chewing tobacco. If you need help quitting, ask your health care provider.  Monitor your blood pressure at home as told by your health care provider.  Keep all follow-up visits as told by your health care provider. This is important. Medicines  Take over-the-counter and prescription medicines only as told by your health care provider. Follow directions carefully. Blood pressure medicines must be taken as prescribed.  Do not skip doses of blood pressure medicine. Doing this puts you at risk for problems and can make the medicine less effective.  Ask your health care provider about side effects or reactions to medicines that you should watch for. Contact a health care provider if you:  Think you are having a reaction to a medicine you are taking.  Have headaches that keep coming back (recurring).  Feel dizzy.  Have swelling in your ankles.  Have trouble with your vision. Get help right away if you:  Develop a severe headache or confusion.  Have unusual weakness or numbness.  Feel faint.  Have severe pain in your chest or abdomen.  Vomit repeatedly.  Have trouble breathing. Summary  Hypertension is when the force of blood pumping through your arteries is too strong. If this condition is not controlled, it may put you at risk for serious complications.  Your personal target blood pressure may vary depending on your medical conditions, your age, and other factors. For most people, a normal blood pressure is less than 120/80.  Hypertension is treated with lifestyle changes, medicines, or a combination of both. Lifestyle changes include losing weight, eating a  healthy, low-sodium diet, exercising more, and limiting alcohol. This information is not intended to replace advice given to you by your health care provider. Make sure you discuss any questions you have with your health care provider. Document Revised: 05/01/2018 Document Reviewed: 05/01/2018 Elsevier Patient Education  2020 [employer].

## 2020-03-16 NOTE — Progress Notes (Signed)
Electrophysiology Office Note   Date:  03/16/2020   ID:  [name], DOB 03-02-1971, MRN [id number]  PCP:  Farris Has, MD  Cardiologist:   Primary Electrophysiologist:  Summar Mcglothlin Jorja Loa, MD    Chief Complaint: Hypertension   History of Present Illness: [name] is a 49 y.o. female who is being seen today for the evaluation of hypertension at the request of [name]. Presenting today for electrophysiology evaluation.  She was seen in GI clinic earlier today.  She has been having multiple loose stools a day and this was her initial work-up.  She has a plan for colonoscopy.  In the clinic, her blood pressure was significantly elevated, 210/120.  She had some headaches and some blurry vision at this time.  She comes into clinic today feeling much improved.  Blood pressure is improved on repeat check.  She says that she has never had issues with blood pressure in the past.  She does have a history of migraines, but her headache this morning was a little bit different.  Today, she denies symptoms of palpitations, chest pain, shortness of breath, orthopnea, PND, lower extremity edema, claudication, dizziness, presyncope, syncope, bleeding, or neurologic sequela. The patient is tolerating medications without difficulties.    Past Medical History:  Diagnosis Date  . Asthma   . CHF (congestive heart failure) (HCC)   . OSA (obstructive sleep apnea)    History reviewed. No pertinent surgical history.   Current Outpatient Medications  Medication Sig Dispense Refill  . ALPRAZolam (XANAX) 0.25 MG tablet Take up to 2 tablets by mouth every 4 hours prn. 30 tablet 0  . pantoprazole (PROTONIX) 40 MG tablet Take 1 tablet (40 mg total) by mouth daily. For acid/GERD suppression. 30 tablet 3  . SUMAtriptan (IMITREX) 100 MG tablet Take 100 mg by mouth every 2 (two) hours as needed.    Marland Kitchen UNABLE TO FIND Lidocaine nasal spray 4 %--take as directed for migraines    . zolpidem  (AMBIEN) 10 MG tablet Take 10 mg by mouth at bedtime as needed.    . zonisamide (ZONEGRAN) 100 MG capsule Take 1 capsule (100 mg total) by mouth daily. 30 capsule 0   No current facility-administered medications for this visit.    Allergies:   Iohexol   Social History:  The patient  reports that she has never smoked. She has never used smokeless tobacco. She reports current alcohol use. She reports that she does not use drugs.   Family History:  The patient's family history includes Aneurysm in her maternal grandmother; Asthma in her brother; Atrial fibrillation in her mother; COPD in her paternal grandmother; Cancer - Lung in her paternal grandfather; Diabetes in her paternal grandmother; Emphysema in her father and paternal grandmother; Fibromyalgia in her paternal grandmother; Hypertension in her paternal grandmother; Lung cancer in her paternal grandfather; Peripheral Artery Disease in her father; Rheum arthritis in her paternal grandmother.    ROS:  Please see the history of present illness.   Otherwise, review of systems is positive for none.   All other systems are reviewed and negative.    PHYSICAL EXAM: VS:  BP (!) 164/90 Comment: retake  Pulse 90   Ht 5\' 3"  (1.6 m)   Wt 200 lb (90.7 kg)   SpO2 97%   BMI 35.43 kg/m  , BMI Body mass index is 35.43 kg/m. GEN: Well nourished, well developed, in no acute distress  HEENT: normal  Neck: no JVD, carotid bruits, or  masses Cardiac: RRR; no murmurs, rubs, or gallops,no edema  Respiratory:  clear to auscultation bilaterally, normal work of breathing GI: soft, nontender, nondistended, + BS MS: no deformity or atrophy  Skin: warm and dry Neuro:  Strength and sensation are intact Psych: euthymic mood, full affect  EKG:  EKG is ordered today. Personal review of the ekg ordered shows sinus tachycardia   Recent Labs: No results found for requested labs within last 8760 hours.    Lipid Panel  No results found for: CHOL, TRIG, HDL,  CHOLHDL, VLDL, LDLCALC, LDLDIRECT   Wt Readings from Last 3 Encounters:  03/16/20 200 lb (90.7 kg)  05/08/14 158 lb (71.7 kg)  03/28/12 183 lb 6.4 oz (83.2 kg)      Other studies Reviewed: Additional studies/ records that were reviewed today include: GI notes    ASSESSMENT AND PLAN:  1.  Hypertension: Blood pressure was significantly elevated this morning.  Has come down since that time.  She is not having symptoms.  Blood pressure on second check in clinic was in the 160s over 90.  She does not have shortness of breath, chest pain, headaches, or blurry vision.  I have told her that if she develops these symptoms, to go to the emergency room or call the clinic.  I have also told her to check her blood pressures multiple times a day over the next few weeks.  We Lino Wickliff have her follow-up in hypertension clinic in 10 to 14 days.  She Candice Tobey likely need establishment with general cardiology.    Current medicines are reviewed at length with the patient today.   The patient does not have concerns regarding her medicines.  The following changes were made today:  none  Labs/ tests ordered today include:  Orders Placed This Encounter  Procedures  . EKG 12-Lead     Disposition:   FU with hypertension clinic 2 weeks  Signed, Jorian Willhoite Jorja Loa, MD  03/16/2020 4:28 PM     [facility] [address] [place] [address] [phone] (office) [phone] (fax)

## 2020-03-30 ENCOUNTER — Ambulatory Visit (INDEPENDENT_AMBULATORY_CARE_PROVIDER_SITE_OTHER): Payer: Managed Care, Other (non HMO) | Admitting: Pharmacist

## 2020-03-30 ENCOUNTER — Other Ambulatory Visit: Payer: Self-pay

## 2020-03-30 VITALS — BP 140/80 | HR 88

## 2020-03-30 DIAGNOSIS — I10 Essential (primary) hypertension: Secondary | ICD-10-CM

## 2020-03-30 MED ORDER — LISINOPRIL 20 MG PO TABS: 20.0000 mg | ORAL_TABLET | Freq: Every day | ORAL | 3 refills | Status: DC

## 2020-03-30 NOTE — Patient Instructions (Addendum)
It was nice to see you today!  Your goal blood pressure is < 130/80 mmHg. In clinic, your blood pressure was 140/90 mmHg.  Medication Changes: Begin Lisinopril 20 mg daily.  Monitor blood pressure at home daily and keep a log (on your phone or piece of paper) to bring with you to your next visit. Write down date, time, blood pressure and pulse.  Make sure to check blood pressure at least 30 mins to 1 hour after taking BP medication   Keep up the good work with diet and exercise. Aim for a diet full of vegetables, fruit and lean meats (chicken, [place], fish). Try to limit salt intake by eating fresh or frozen vegetables (instead of canned), rinse canned vegetables prior to cooking and do not add any additional salt to meals.   Please give [place] a call at [phone] with any questions or concerns.

## 2020-03-30 NOTE — Progress Notes (Signed)
Patient ID: [name]                 DOB: [birth year]                      MRN: [id number]     HPI: [name] is a 49 y.o. female referred by Dr. Elberta Fortis to HTN clinic. PMH is significant for HTN, migraines, asthma, and OSA.   At last visit on 03/16/20, Dr. Elberta Fortis noted that her blood pressure was significantly elevated, 210/120 at her GI clinic earlier that day prior to her cardiology visit and experienced some headaches and blurry vision. Then at her cardiology visit later the same day, blood pressure improved to 164/90 and denied experiencing headaches or blurry vision. Patient stated she never had issues with blood pressure in the past.  Patient presents today in good spirits. Reports overall home BP readings have been elevated, ranging from 140-180s/80-90s, with a few SBPs ranging 110-120s, HR 80-90s. She frequently experiences headaches behind both eyes and SOB when SBP is 150-180s, and reports last headache was yesterday with a BP of 172/94. She is currently not on any BP medications, however has tried lisinopril in the past for approximately 3 months about 10 years ago when she had CHF which has since resolved. She also reports that when her BP was 187/102 1-2 weeks ago, she took 1 tablet of lisinopril 10 mg (husband's BP medication) and her BP improved and she felt better. Patient has a history of peripheral edema in both LE that occurs about twice every 6 months. Denies frequent NSAID use, coffee intake, and tobacco products. Patient reports she tries to eat vegetables, fruits and a low-sodium diet, and drinks water, sparkling water, and diet coke (1-2 times a day), and is trying to find more time to exercise.   Current HTN meds: None BP goal: < 130/80 mmHg  Family History: PAD (father), Afib (mother), aneursym (maternal grandmother), DM and HTN (paternal grandmother)  Social History: Reports alcohol use. Denies tobacco, smokeless tobacco and illicit drug use.  Diet: tries  to eat vegetables, fruits and a low-sodium diet, and drinks water, sparkling water, and diet coke (1-2 times a day)  Exercise: trying to find more time to exercise.   Home BP readings: Average BP for two weeks: 140-180s/80-90s, with a few SBPs ranging 110-120s -Highs of 172/94, 187/102 (experienced headaches and SOB) -Lows of 116/81, 127/77  Wt Readings from Last 3 Encounters:  03/16/20 200 lb (90.7 kg)  05/08/14 158 lb (71.7 kg)  03/28/12 183 lb 6.4 oz (83.2 kg)   BP Readings from Last 3 Encounters:  03/30/20 (!) 140/80  03/16/20 (!) 164/90  05/08/14 129/73   Pulse Readings from Last 3 Encounters:  03/30/20 88  03/16/20 90  05/08/14 74    Renal function: CrCl cannot be calculated (No successful lab value found.).  Past Medical History:  Diagnosis Date  . Asthma   . CHF (congestive heart failure) (HCC)   . OSA (obstructive sleep apnea)     Current Outpatient Medications on File Prior to Visit  Medication Sig Dispense Refill  . ALPRAZolam (XANAX) 0.25 MG tablet Take up to 2 tablets by mouth every 4 hours prn. 30 tablet 0  . pantoprazole (PROTONIX) 40 MG tablet Take 1 tablet (40 mg total) by mouth daily. For acid/GERD suppression. 30 tablet 3  . SUMAtriptan (IMITREX) 100 MG tablet Take 100 mg by mouth every 2 (two) hours as needed.    Marland Kitchen  UNABLE TO FIND Lidocaine nasal spray 4 %--take as directed for migraines    . zolpidem (AMBIEN) 10 MG tablet Take 10 mg by mouth at bedtime as needed.    . zonisamide (ZONEGRAN) 100 MG capsule Take 1 capsule (100 mg total) by mouth daily. 30 capsule 0   No current facility-administered medications on file prior to visit.    Allergies  Allergen Reactions  . Iohexol Hives     Assessment/Plan:  1. Hypertension - BP above goal < 130/80 mmHg. Will start lisinopril 20 mg daily as clinic BP and home BP readings are elevated. Counseled patient on side effects and the importance of medication adherence. Encouraged patient to eat a healthy  diet incorporating vegetables, fruits, and lean meat, and to limit starches, salt and red meat. Also encouraged patient to walk at least 20 minutes every other day, with a goal of 150 minutes per week. Will check BMET and conduct a HTN follow-up in 2 weeks after starting lisinopril.   Fabio Neighbors, PharmD PGY2 Ambulatory Care Pharmacy Resident [facility] [address] [place], [place] [address] Phone: [phone]; Fax: [phone]

## 2020-04-12 NOTE — Progress Notes (Signed)
Patient ID: [name]                 DOB: April 29, 1971                      MRN: [id number]     HPI: [name] is a 49 y.o. female referred by Dr. Elberta Fortis to HTN clinic. PMH is significant for HTN, migraines, asthma, OSA, and hx of LE peripheral edema that occurs twice every 6 months.  At last visit with the clinical pharmacist on 03/30/20, clinic and home BP were above goal. Patient frequently experienced headaches behind both eyes and SOB when SBP is 150-180. Patient was started on Lisinopril 20 mg daily.   Patient presents today in good spirits. Reports home BP readings remained elevated which caused her to increase lisinopril to 30 mg daily (10 mg in the morning and 20 mg at night). She reports headaches, SOB and heart palpitations when BP is elevated. Denies dizziness, blurred vision, swelling, and dry cough. Home BP readings since 7/29 range from 130-180s/80-90s (highs of 182/90, 179/91, 185/97, 167/89; lows of 125/90, 132/89, 122/83, 132/84), however BP slightly improved (140-150s) when she increased lisinopril to 30 mg daily starting 8/6.   Current HTN meds: Lisinopril 30 mg daily BP goal: < 130/80 mmHg  Family History: PAD (father), Afib (mother), aneursym (maternal grandmother), DM and HTN (paternal grandmother)  Social History: Reports alcohol use. Denies tobacco, smokeless tobacco and illicit drug use.  Diet: tries to eat vegetables, fruits and a low-sodium diet, and drinks water, sparkling water, and diet coke (1-2 times a day)  Exercise: trying to find more time to exercise.   Home BP readings:  7/29 - 8/10: 130-180s/80-90s (highs of 182/90, 179/91, 185/97, 167/89; lows of 125/90, 132/89, 122/83, 132/84)  Wt Readings from Last 3 Encounters:  03/16/20 200 lb (90.7 kg)  05/08/14 158 lb (71.7 kg)  03/28/12 183 lb 6.4 oz (83.2 kg)   BP Readings from Last 3 Encounters:  04/13/20 (!) 145/77  03/30/20 (!) 140/80  03/16/20 (!) 164/90   Pulse Readings from  Last 3 Encounters:  04/13/20 87  03/30/20 88  03/16/20 90    Renal function: CrCl cannot be calculated (No successful lab value found.).  Past Medical History:  Diagnosis Date   Asthma    CHF (congestive heart failure) (HCC)    OSA (obstructive sleep apnea)     Current Outpatient Medications on File Prior to Visit  Medication Sig Dispense Refill   ALPRAZolam (XANAX) 0.25 MG tablet Take up to 2 tablets by mouth every 4 hours prn. 30 tablet 0   lisinopril (ZESTRIL) 20 MG tablet Take 1 tablet (20 mg total) by mouth daily. 90 tablet 3   pantoprazole (PROTONIX) 40 MG tablet Take 1 tablet (40 mg total) by mouth daily. For acid/GERD suppression. 30 tablet 3   SUMAtriptan (IMITREX) 100 MG tablet Take 100 mg by mouth every 2 (two) hours as needed.     UNABLE TO FIND Lidocaine nasal spray 4 %--take as directed for migraines     zolpidem (AMBIEN) 10 MG tablet Take 10 mg by mouth at bedtime as needed.     zonisamide (ZONEGRAN) 100 MG capsule Take 1 capsule (100 mg total) by mouth daily. 30 capsule 0   No current facility-administered medications on file prior to visit.    Allergies  Allergen Reactions   Iohexol Hives    Assessment/Plan:  1. Hypertension - BP above goal < 130/80  mg/dL as clinic BP and home BP readings remain elevated. Will consider combination hypertension medication with thiazide diuretic pending BMET results. Encouraged patient to continue checking BP daily and bring log to next follow-up visit (will schedule pending lab results).  Fabio Neighbors, PharmD PGY2 Ambulatory Care Pharmacy Resident [facility] [address] [place], [place] [address] Phone: [phone]; Fax: [phone]

## 2020-04-13 ENCOUNTER — Other Ambulatory Visit: Payer: Self-pay

## 2020-04-13 ENCOUNTER — Ambulatory Visit: Payer: Managed Care, Other (non HMO) | Admitting: Pharmacist

## 2020-04-13 ENCOUNTER — Other Ambulatory Visit: Payer: Managed Care, Other (non HMO)

## 2020-04-13 VITALS — BP 145/77 | HR 87

## 2020-04-13 DIAGNOSIS — I10 Essential (primary) hypertension: Secondary | ICD-10-CM | POA: Diagnosis not present

## 2020-04-13 NOTE — Patient Instructions (Addendum)
It was nice to see you today!  Your goal blood pressure is < 130/80 mmHg. In clinic, your blood pressure was 145/77 mmHg.  Medication Changes: Once we review your labs and they are normal, we will give you a call tomorrow about increasing the dose of lisinopril and to make a follow-up visit.   Monitor blood pressure at home daily and keep a log (on your phone or piece of paper) to bring with you to your next visit. Write down date, time, blood pressure and pulse.  Keep up the good work with diet and exercise. Aim for a diet full of vegetables, fruit and lean meats (chicken, [place], fish). Try to limit salt intake by eating fresh or frozen vegetables (instead of canned), rinse canned vegetables prior to cooking and do not add any additional salt to meals.   Please give [place] a call at [phone] with any questions or concerns.

## 2020-04-14 ENCOUNTER — Telehealth: Payer: Self-pay | Admitting: Pharmacist

## 2020-04-14 DIAGNOSIS — I10 Essential (primary) hypertension: Secondary | ICD-10-CM

## 2020-04-14 LAB — BASIC METABOLIC PANEL
BUN/Creatinine Ratio: 10 (ref 9–23)
BUN: 7 mg/dL (ref 6–24)
CO2: 20 mmol/L (ref 20–29)
Calcium: 9.9 mg/dL (ref 8.7–10.2)
Chloride: 101 mmol/L (ref 96–106)
Creatinine, Ser: 0.7 mg/dL (ref 0.57–1.00)
GFR calc Af Amer: 118 mL/min/{1.73_m2} (ref >59)
GFR calc non Af Amer: 102 mL/min/{1.73_m2} (ref >59)
Glucose: 96 mg/dL (ref 65–99)
Potassium: 4.7 mmol/L (ref 3.5–5.2)
Sodium: 137 mmol/L (ref 134–144)

## 2020-04-14 MED ORDER — VALSARTAN-HYDROCHLOROTHIAZIDE 320-25 MG PO TABS: 1.0000 | ORAL_TABLET | Freq: Every day | ORAL | 3 refills | Status: DC

## 2020-04-14 NOTE — Telephone Encounter (Signed)
Contacted patient to inform her that potassium and renal function are normal, therefore will discontinue Lisinopril and start Valsartan-HCTZ 320/25 mg once daily. Counseled patient on potential side effects. Prescription sent to preferred pharmacy. Follow-up appointment and labs scheduled for August 31st at [facility] for HTN management.  Fabio Neighbors, PharmD PGY2 Ambulatory Care Pharmacy Resident [facility] [address], [place], [place] [address] Phone: [phone]; Fax: [phone]

## 2020-04-27 ENCOUNTER — Ambulatory Visit: Payer: Self-pay | Admitting: Cardiology

## 2020-05-03 NOTE — Progress Notes (Unsigned)
Patient ID: [name]                 DOB: 12/07/1970                      MRN: [id number]     HPI: [name] is a 49 y.o. female referred by Dr. Clarene Essex HTN clinic. PMH is significant for HTN, migraines, asthma, OSA, and hx of LE peripheral edema that occurs twice every 6 months. At last visit on 04/13/2020 in HTN clinic, lisinopril was discontinued and valsartan-HCTZ 320/25 mg daily was started for additional BP management.  Patient presents today in good spirits.   CHECK BMET today! Send self-staff message  Compliance? Took meds this morning? -valsartan-HCTZ 320/25 daily When do you take your meds? Dizziness, headaches, blurred vision? -headaches, SOB, palpitations improved?? History of swelling? Check Clinic BP? Home BP logs? If no logs, bring to next visit w/ BP cuff Go over BP goals Additional BP therapy if needed . Amlodipine 5 mg  Diet??  Exercise??   Current HTN meds: Valsartan-HCTZ 320/25 mg daily BP goal:< 130/80 mmHg  Family History:PAD (father), Afib (mother), aneursym (maternal grandmother), DM and HTN (paternal grandmother)  Social History:Reports alcohol use. Denies tobacco, smokeless tobacco and illicit drug use.  Diet:tries to eat vegetables, fruits and a low-sodium diet, and drinks water, sparkling water, and diet coke (1-2 times a day)  Exercise:trying to find more time to exercise.  Home BP readings:   Wt Readings from Last 3 Encounters:  03/16/20 200 lb (90.7 kg)  05/08/14 158 lb (71.7 kg)  03/28/12 183 lb 6.4 oz (83.2 kg)   BP Readings from Last 3 Encounters:  04/13/20 (!) 145/77  03/30/20 (!) 140/80  03/16/20 (!) 164/90   Pulse Readings from Last 3 Encounters:  04/13/20 87  03/30/20 88  03/16/20 90    Renal function: CrCl cannot be calculated (Unknown ideal weight.).  Past Medical History:  Diagnosis Date  . Asthma   . CHF (congestive heart failure) (HCC)   . OSA (obstructive sleep apnea)     Current  Outpatient Medications on File Prior to Visit  Medication Sig Dispense Refill  . ALPRAZolam (XANAX) 0.25 MG tablet Take up to 2 tablets by mouth every 4 hours prn. 30 tablet 0  . pantoprazole (PROTONIX) 40 MG tablet Take 1 tablet (40 mg total) by mouth daily. For acid/GERD suppression. 30 tablet 3  . SUMAtriptan (IMITREX) 100 MG tablet Take 100 mg by mouth every 2 (two) hours as needed.    Marland Kitchen UNABLE TO FIND Lidocaine nasal spray 4 %--take as directed for migraines    . valsartan-hydrochlorothiazide (DIOVAN-HCT) 320-25 MG tablet Take 1 tablet by mouth daily. 90 tablet 3  . zolpidem (AMBIEN) 10 MG tablet Take 10 mg by mouth at bedtime as needed.    . zonisamide (ZONEGRAN) 100 MG capsule Take 1 capsule (100 mg total) by mouth daily. 30 capsule 0   No current facility-administered medications on file prior to visit.    Allergies  Allergen Reactions  . Iohexol Hives     Assessment/Plan:  1. Hypertension -    Fabio Neighbors, PharmD PGY2 Ambulatory Care Pharmacy Resident [facility] [address], [place], [place] [address] Phone: [phone]; Fax: [phone]

## 2020-05-04 ENCOUNTER — Other Ambulatory Visit: Payer: Managed Care, Other (non HMO)

## 2020-05-04 ENCOUNTER — Ambulatory Visit: Payer: Managed Care, Other (non HMO)

## 2020-05-28 ENCOUNTER — Other Ambulatory Visit: Payer: Self-pay

## 2020-05-31 NOTE — Progress Notes (Unsigned)
Patient ID: [name]                 DOB: [birth year]                      MRN: [id number]     HPI: [name] is a 49 y.o. female referred by Dr. Clarene Essex HTN clinic. PMH is significant for HTN, migraines, asthma, OSA, and hx of LE peripheral edema that occurs twice every 6 months. At last visit in HTN clinic on 04/14/20, lisinopril 30 mg daily was discontinued and patient started on valsartan-HCTZ 320-25 mg daily for additional BP management.  Patient presents today in good spirits. Reports ***  Check BMET today - after starting valsartan-HCTZ  Compliance? Took meds this morning? When do you take your meds? Dizziness, headaches, blurred vision? Palpitations? SOB? Recent swelling? Check Clinic BP? Home BP logs? If no logs, bring to next visit w/ BP cuff Go over BP goals Additional BP therapy if needed . Amlodipine 5 mg  . Spironolactone 12.5  Diet??  Exercise??    Current HTN meds: Valsartan-HCTZ 320-25 mg daily Previously tried: lisinopril 30 mg daily BP goal: <130/80 mmHg  Family History:PAD (father), Afib (mother), aneursym (maternal grandmother), DM and HTN (paternal grandmother)  Social History:Reports alcohol use. Denies tobacco, smokeless tobacco and illicit drug use.  Diet:tries to eat vegetables, fruits and a low-sodium diet, and drinks water, sparkling water, and diet coke (1-2 times a day)  Exercise:trying to find more time to exercise Home BP readings:   Wt Readings from Last 3 Encounters:  03/16/20 200 lb (90.7 kg)  05/08/14 158 lb (71.7 kg)  03/28/12 183 lb 6.4 oz (83.2 kg)   BP Readings from Last 3 Encounters:  04/13/20 (!) 145/77  03/30/20 (!) 140/80  03/16/20 (!) 164/90   Pulse Readings from Last 3 Encounters:  04/13/20 87  03/30/20 88  03/16/20 90    Renal function: CrCl cannot be calculated (Patient's most recent lab result is older than the maximum 21 days allowed.).  Past Medical History:  Diagnosis Date  .  Asthma   . CHF (congestive heart failure) (HCC)   . OSA (obstructive sleep apnea)     Current Outpatient Medications on File Prior to Visit  Medication Sig Dispense Refill  . ALPRAZolam (XANAX) 0.25 MG tablet Take up to 2 tablets by mouth every 4 hours prn. 30 tablet 0  . pantoprazole (PROTONIX) 40 MG tablet Take 1 tablet (40 mg total) by mouth daily. For acid/GERD suppression. 30 tablet 3  . SUMAtriptan (IMITREX) 100 MG tablet Take 100 mg by mouth every 2 (two) hours as needed.    Marland Kitchen UNABLE TO FIND Lidocaine nasal spray 4 %--take as directed for migraines    . valsartan-hydrochlorothiazide (DIOVAN-HCT) 320-25 MG tablet Take 1 tablet by mouth daily. 90 tablet 3  . zolpidem (AMBIEN) 10 MG tablet Take 10 mg by mouth at bedtime as needed.    . zonisamide (ZONEGRAN) 100 MG capsule Take 1 capsule (100 mg total) by mouth daily. 30 capsule 0   No current facility-administered medications on file prior to visit.    Allergies  Allergen Reactions  . Iohexol Hives     Assessment/Plan:  1. Hypertension -   Fabio Neighbors, PharmD PGY2 Ambulatory Care Pharmacy Resident [facility] [address] [place], [place] [address] Phone: [phone]; Fax: [phone]

## 2020-06-01 ENCOUNTER — Other Ambulatory Visit: Payer: Managed Care, Other (non HMO)

## 2020-06-01 ENCOUNTER — Ambulatory Visit: Payer: Managed Care, Other (non HMO)

## 2020-06-08 ENCOUNTER — Ambulatory Visit: Payer: Managed Care, Other (non HMO)

## 2020-06-15 ENCOUNTER — Encounter (INDEPENDENT_AMBULATORY_CARE_PROVIDER_SITE_OTHER): Payer: Self-pay

## 2020-06-15 ENCOUNTER — Other Ambulatory Visit (HOSPITAL_COMMUNITY)
Admission: RE | Admit: 2020-06-15 | Discharge: 2020-06-15 | Disposition: A | Discharge status: Home / Self Care | Payer: Managed Care, Other (non HMO) | Source: Ambulatory Visit | Attending: Cardiology | Admitting: Cardiology

## 2020-06-15 ENCOUNTER — Ambulatory Visit: Payer: Managed Care, Other (non HMO) | Admitting: Cardiology

## 2020-06-15 ENCOUNTER — Encounter: Payer: Self-pay | Admitting: Cardiology

## 2020-06-15 ENCOUNTER — Other Ambulatory Visit: Payer: Self-pay

## 2020-06-15 VITALS — BP 124/70 | HR 89 | Ht 63.0 in | Wt 204.0 lb

## 2020-06-15 DIAGNOSIS — I428 Other cardiomyopathies: Secondary | ICD-10-CM | POA: Diagnosis not present

## 2020-06-15 DIAGNOSIS — I10 Essential (primary) hypertension: Secondary | ICD-10-CM

## 2020-06-15 NOTE — Progress Notes (Signed)
Cardiology Office Note  Date: 06/15/2020   ID: [name], DOB [birth year], MRN [id number]  PCP:  Farris Has, MD  Cardiologist:  Nona Dell, MD Electrophysiologist:  None   Chief Complaint  Patient presents with  . Hypertension    History of Present Illness: [name] is a 49 y.o. female seen initially by Dr. Elberta Fortis in July for consultation regarding control of hypertension.  She has had subsequent visits in the hypertension clinic in [place] and now presents for a general cardiology follow-up.  She is currently on Diovan HCT as noted below, tolerating the medication well and her blood pressure is well controlled today.  She is due for follow-up lab work however.  Presently not on a potassium supplement.  She does not report any limiting exertional symptoms.  She works from home as a Engineer, civil (consulting) for [employer].  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  I do not have complete records, but see that she underwent an echocardiogram at that time that initially revealed LVEF 45 to 50%, although this subsequently normalized.   Past Medical History:  Diagnosis Date  . Asthma   . Essential hypertension   . Fibromyalgia   . History of cardiomyopathy    Nonischemic 2011, LVEF 45-50%  . OSA (obstructive sleep apnea)     Past Surgical History:  Procedure Laterality Date  . CESAREAN SECTION    . HUMERUS FRACTURE SURGERY Left   . LIPOSUCTION    . Skin cyst removal    . TONSILLECTOMY AND ADENOIDECTOMY    . TYMPANOTOMY      Current Outpatient Medications  Medication Sig Dispense Refill  . ALPRAZolam (XANAX) 0.25 MG tablet Take up to 2 tablets by mouth every 4 hours prn. 30 tablet 0  . DULoxetine (CYMBALTA) 30 MG capsule Take 30 mg by mouth daily.    Marland Kitchen estradiol-norethindrone (ACTIVELLA) 1-0.5 MG tablet Take 1 tablet by mouth at bedtime.    . furosemide (LASIX) 10 MG/ML  solution Take by mouth as needed.    . lansoprazole (PREVACID) 15 MG capsule Take 30 mg by mouth in the morning and at bedtime.    . SUMAtriptan (IMITREX) 100 MG tablet Take 100 mg by mouth every 2 (two) hours as needed.    . valsartan-hydrochlorothiazide (DIOVAN-HCT) 320-25 MG tablet Take 1 tablet by mouth daily. 90 tablet 3  . zolpidem (AMBIEN) 10 MG tablet Take 10 mg by mouth at bedtime as needed.    . zonisamide (ZONEGRAN) 100 MG capsule Take 1 capsule (100 mg total) by mouth daily. 30 capsule 0   No current facility-administered medications for this visit.   Allergies:  Iodine-131 and Iohexol   Social History: The patient  reports that she has never smoked. She has never used smokeless tobacco. She reports current alcohol use. She reports that she does not use drugs.   Family History: The patient's family history includes Aneurysm in her maternal grandmother; Asthma in her brother; Atrial fibrillation in her mother; COPD in her paternal grandmother; Cancer - Lung in her paternal grandfather; Diabetes in her paternal grandmother; Emphysema in her father and paternal grandmother; Fibromyalgia in her paternal grandmother; Hypertension in her paternal grandmother; Lung cancer in her paternal grandfather; Peripheral Artery Disease in her father; Rheum arthritis in her paternal grandmother.   ROS: She reports occasional palpitations, brief skips that are possibly PVCs based on description.  No syncope.  Physical Exam: VS:  BP 124/70   Pulse 89   Ht 5\' 3"  (1.6 m)   Wt 204 lb (92.5 kg)   SpO2 99%   BMI 36.14 kg/m , BMI Body mass index is 36.14 kg/m.  Wt Readings from Last 3 Encounters:  06/15/20 204 lb (92.5 kg)  03/16/20 200 lb (90.7 kg)  05/08/14 158 lb (71.7 kg)    General: Patient appears comfortable at rest. HEENT: Conjunctiva and lids normal, wearing a mask. Neck: Supple, no elevated JVP or carotid bruits. Lungs: Clear to auscultation, nonlabored breathing at rest. Cardiac:  Regular rate and rhythm, no S3 or significant systolic murmur. Abdomen: Soft, bowel sounds present. Extremities: No pitting edema.  ECG:  An ECG dated 03/16/2020 was personally reviewed today and demonstrated:  Normal sinus rhythm.  Recent Labwork: 04/13/2020: BUN 7; Creatinine, Ser 0.70; Potassium 4.7; Sodium 137   Other Studies Reviewed Today:  Echocardiogram 06/29/2010: Study Conclusions  Left ventricle: The cavity size was normal. Wall thickness was  normal. Systolic function was normal. The estimated ejection  fraction was in the range of 55% to 60%. Wall motion was normal;  there were no regional wall motion abnormalities. Left ventricular  diastolic function parameters were normal.   Assessment and Plan:  1.  Essential hypertension.  Blood pressure is well controlled today on Diovan HCT.  We will obtain a BMET for follow-up.  Currently not on potassium supplement.  We discussed diet and exercise, also continue to follow-up with PCP.  2.  History of nonischemic, possibly viral cardiomyopathy in 2011 with LVEF 45 to 50% and subsequent normalization.  This has not been reassessed over time, we will obtain a repeat echocardiogram.  Medication Adjustments/Labs and Tests Ordered: Current medicines are reviewed at length with the patient today.  Concerns regarding medicines are outlined above.   Tests Ordered: Orders Placed This Encounter  Procedures  . Basic Metabolic Panel (BMET)  . ECHOCARDIOGRAM COMPLETE    Medication Changes: No orders of the defined types were placed in this encounter.   Disposition:  Follow up test results and determine disposition.  Signed, 2012, MD, [facility] 06/15/2020 1:52 PM    [facility] at [facility] [address] [place], [place] Phone: [phone]; Fax: [phone]

## 2020-06-15 NOTE — Patient Instructions (Signed)
Medication Instructions:  Your physician recommends that you continue on your current medications as directed. Please refer to the Current Medication list given to you today.  *If you need a refill on your cardiac medications before your next appointment, please call your pharmacy*   Lab Work: Your physician recommends that you return for lab work in: Today   If you have labs (blood work) drawn today and your tests are completely normal, you will receive your results only by: Marland Kitchen MyChart Message (if you have MyChart) OR . A paper copy in the mail If you have any lab test that is abnormal or we need to change your treatment, we will call you to review the results.   Testing/Procedures: Your physician has requested that you have an echocardiogram. Echocardiography is a painless test that uses sound waves to create images of your heart. It provides your doctor with information about the size and shape of your heart and how well your heart's chambers and valves are working. This procedure takes approximately one hour. There are no restrictions for this procedure.     Follow-Up: At [facility], you and your health needs are our priority.  As part of our continuing mission to provide you with exceptional heart care, we have created designated Provider Care Teams.  These Care Teams include your primary Cardiologist (physician) and Advanced Practice Providers (APPs -  Physician Assistants and Nurse Practitioners) who all work together to provide you with the care you need, when you need it.  We recommend signing up for the patient portal called "MyChart".  Sign up information is provided on this After Visit Summary.  MyChart is used to connect with patients for Virtual Visits (Telemedicine).  Patients are able to view lab/test results, encounter notes, upcoming appointments, etc.  Non-urgent messages can be sent to your provider as well.   To learn more about what you can do with MyChart, go to  [email or web address].    Your next appointment:    We will call with test results and follow-up   The format for your next appointment:   In Person  Provider:   Nona Dell, MD   Other Instructions Thank you for choosing [facility]!

## 2020-06-28 ENCOUNTER — Other Ambulatory Visit: Payer: Self-pay

## 2020-06-28 ENCOUNTER — Ambulatory Visit (HOSPITAL_COMMUNITY)
Admission: RE | Admit: 2020-06-28 | Discharge: 2020-06-28 | Disposition: A | Discharge status: Home / Self Care | Payer: Managed Care, Other (non HMO) | Source: Ambulatory Visit | Attending: Cardiology | Admitting: Cardiology

## 2020-06-28 ENCOUNTER — Other Ambulatory Visit (HOSPITAL_COMMUNITY)
Admission: RE | Admit: 2020-06-28 | Discharge: 2020-06-28 | Disposition: A | Discharge status: Home / Self Care | Payer: Managed Care, Other (non HMO) | Source: Ambulatory Visit | Attending: Cardiology | Admitting: Cardiology

## 2020-06-28 ENCOUNTER — Telehealth: Payer: Self-pay

## 2020-06-28 DIAGNOSIS — I428 Other cardiomyopathies: Secondary | ICD-10-CM | POA: Insufficient documentation

## 2020-06-28 DIAGNOSIS — I10 Essential (primary) hypertension: Secondary | ICD-10-CM

## 2020-06-28 LAB — BASIC METABOLIC PANEL
Anion gap: 13 (ref 5–15)
BUN: 8 mg/dL (ref 6–20)
CO2: 20 mmol/L — ABNORMAL LOW (ref 22–32)
Calcium: 9.3 mg/dL (ref 8.9–10.3)
Chloride: 106 mmol/L (ref 98–111)
Creatinine, Ser: 0.55 mg/dL (ref 0.44–1.00)
GFR, Estimated: >60 mL/min (ref >60)
Glucose, Bld: 105 mg/dL — ABNORMAL HIGH (ref 70–99)
Potassium: 3.8 mmol/L (ref 3.5–5.1)
Sodium: 139 mmol/L (ref 135–145)

## 2020-06-28 LAB — ECHOCARDIOGRAM COMPLETE
AR max vel: 2.2 cm2
AV Area VTI: 2.34 cm2
AV Area mean vel: 2.02 cm2
AV Mean grad: 4.8 mmHg
AV Peak grad: 10.7 mmHg
Ao pk vel: 1.64 m/s
S' Lateral: 2.61 cm

## 2020-06-28 MED ORDER — VALSARTAN-HYDROCHLOROTHIAZIDE 320-25 MG PO TABS: 1.0000 | ORAL_TABLET | Freq: Every day | ORAL | 3 refills | Status: DC

## 2020-06-28 MED ORDER — POTASSIUM CHLORIDE ER 10 MEQ PO TBCR: 10.0000 meq | EXTENDED_RELEASE_TABLET | Freq: Every day | ORAL | 3 refills | Status: DC

## 2020-06-28 NOTE — Telephone Encounter (Signed)
Patient walked in, results given.E-[facility] to [facility],potassium 10 meq qd #90

## 2020-06-28 NOTE — Progress Notes (Signed)
*  PRELIMINARY RESULTS* Echocardiogram 2D Echocardiogram has been performed.  Stacey Drain 06/28/2020, 3:48 PM

## 2020-06-28 NOTE — Telephone Encounter (Signed)
-----   Message from Jonelle Sidle, MD sent at 06/28/2020  3:33 PM EDT ----- Results reviewed.  Glucose mildly elevated.  Creatinine is normal however and potassium 3.8 which is down from 4.7.  Since she is on Diovan HCT, suggest adding KCl 10 mEq daily.

## 2021-01-03 ENCOUNTER — Other Ambulatory Visit: Payer: Self-pay

## 2021-01-03 DIAGNOSIS — I83893 Varicose veins of bilateral lower extremities with other complications: Secondary | ICD-10-CM

## 2021-01-13 ENCOUNTER — Other Ambulatory Visit: Payer: Self-pay

## 2021-01-13 ENCOUNTER — Encounter: Payer: 59 | Admitting: Vascular Surgery

## 2021-01-13 ENCOUNTER — Ambulatory Visit (HOSPITAL_COMMUNITY)
Admission: RE | Admit: 2021-01-13 | Discharge: 2021-01-13 | Disposition: A | Discharge status: Home / Self Care | Payer: 59 | Source: Ambulatory Visit | Attending: Vascular Surgery | Admitting: Vascular Surgery

## 2021-01-13 DIAGNOSIS — I83893 Varicose veins of bilateral lower extremities with other complications: Secondary | ICD-10-CM | POA: Diagnosis not present

## 2021-01-14 ENCOUNTER — Encounter: Payer: 59 | Admitting: Vascular Surgery

## 2021-01-17 DIAGNOSIS — K219 Gastro-esophageal reflux disease without esophagitis: Secondary | ICD-10-CM | POA: Insufficient documentation

## 2021-01-17 DIAGNOSIS — R609 Edema, unspecified: Secondary | ICD-10-CM | POA: Insufficient documentation

## 2021-01-17 DIAGNOSIS — E785 Hyperlipidemia, unspecified: Secondary | ICD-10-CM | POA: Insufficient documentation

## 2021-01-17 DIAGNOSIS — R079 Chest pain, unspecified: Secondary | ICD-10-CM | POA: Insufficient documentation

## 2021-01-17 DIAGNOSIS — G4700 Insomnia, unspecified: Secondary | ICD-10-CM | POA: Insufficient documentation

## 2021-01-17 DIAGNOSIS — F419 Anxiety disorder, unspecified: Secondary | ICD-10-CM | POA: Insufficient documentation

## 2021-01-21 ENCOUNTER — Other Ambulatory Visit: Payer: Self-pay

## 2021-01-21 ENCOUNTER — Ambulatory Visit: Payer: 59 | Admitting: Vascular Surgery

## 2021-01-21 ENCOUNTER — Encounter: Payer: Self-pay | Admitting: Vascular Surgery

## 2021-01-21 VITALS — BP 126/84 | HR 76 | Temp 97.8°F | Resp 20 | Ht 63.0 in | Wt 195.0 lb

## 2021-01-21 DIAGNOSIS — I83893 Varicose veins of bilateral lower extremities with other complications: Secondary | ICD-10-CM

## 2021-01-21 NOTE — Progress Notes (Signed)
Patient ID: [name], female   DOB: 12/20/1970, 50 y.o.   MRN: [id number]  Reason for Consult: New Patient (Initial Visit) and Varicose Veins   Referred by Farris HasMorrow, Aaron, MD  Subjective:     HPI:  [name] is a 50 y.o. female history of bilateral lower extremity varicose veins that have been treated in the past with saphenous vein ablation and stab phlebectomy.  She now has recurrent mostly spider veins.  She states that her legs feel heavy when she walks she does have some swelling.  Mostly has a prominent left posterior thigh varicosity and spider veins throughout her legs below the knee.  She has never had history of DVT.  No previous knee replacement surgery  Past Medical History:  Diagnosis Date  . Asthma   . Essential hypertension   . Fibromyalgia   . History of cardiomyopathy    Nonischemic 2011, LVEF 45-50%  . OSA (obstructive sleep apnea)    Family History  Problem Relation Age of Onset  . Emphysema Father        Tobacco use  . Peripheral Artery Disease Father   . Emphysema Paternal Grandmother        Tobacco use  . Rheum arthritis Paternal Grandmother   . Diabetes Paternal Grandmother   . Hypertension Paternal Grandmother   . Fibromyalgia Paternal Grandmother   . COPD Paternal Grandmother   . Asthma Brother   . Lung cancer Paternal Grandfather        Tobacco use  . Cancer - Lung Paternal Grandfather   . Atrial fibrillation Mother   . Aneurysm Maternal Grandmother    Past Surgical History:  Procedure Laterality Date  . CESAREAN SECTION    . HUMERUS FRACTURE SURGERY Left   . LIPOSUCTION    . Skin cyst removal    . TONSILLECTOMY AND ADENOIDECTOMY    . TYMPANOTOMY      Short Social History:  Social History   Tobacco Use  . Smoking status: Never Smoker  . Smokeless tobacco: Never Used  Substance Use Topics  . Alcohol use: Yes    Comment: Socially    Allergies  Allergen Reactions  . Bupropion     Other reaction(s): HA, nausea  .  Buspirone Hcl     Other reaction(s): irritability  . Citalopram Hydrobromide     Other reaction(s): irritability  . Gabapentin     Other reaction(s): swelling & dizziness  . Iodine-131 Other (See Comments)    other  . Iohexol Hives  . Venlafaxine     Other reaction(s): Hair Loss    Current Outpatient Medications  Medication Sig Dispense Refill  . ALPRAZolam (XANAX) 0.25 MG tablet Take up to 2 tablets by mouth every 4 hours prn. 30 tablet 0  . DULoxetine (CYMBALTA) 30 MG capsule Take 30 mg by mouth daily.    . furosemide (LASIX) 10 MG/ML solution Take by mouth as needed.    . lansoprazole (PREVACID) 15 MG capsule Take 30 mg by mouth in the morning and at bedtime.    . potassium chloride (KLOR-CON) 10 MEQ tablet Take 1 tablet (10 mEq total) by mouth daily. 90 tablet 3  . SUMAtriptan (IMITREX) 100 MG tablet Take 100 mg by mouth every 2 (two) hours as needed.    . valsartan-hydrochlorothiazide (DIOVAN-HCT) 320-25 MG tablet Take 1 tablet by mouth daily. 90 tablet 3  . zolpidem (AMBIEN) 10 MG tablet Take 10 mg by mouth at bedtime as needed.    .Marland Kitchen  zonisamide (ZONEGRAN) 100 MG capsule Take 1 capsule (100 mg total) by mouth daily. 30 capsule 0  . estradiol-norethindrone (ACTIVELLA) 1-0.5 MG tablet Take 1 tablet by mouth at bedtime. (Patient not taking: Reported on 01/21/2021)     No current facility-administered medications for this visit.    Review of Systems  Constitutional:  Constitutional negative. HENT: HENT negative.  Eyes: Eyes negative.  Respiratory: Respiratory negative.  Cardiovascular: Positive for leg swelling.  GI: Gastrointestinal negative.  Musculoskeletal: Musculoskeletal negative.  Skin: Skin negative.  Neurological: Neurological negative. Hematologic: Hematologic/lymphatic negative.  Psychiatric: Psychiatric negative.        Objective:  Objective   Vitals:   01/21/21 1423  BP: 126/84  Pulse: 76  Resp: 20  Temp: 97.8 F (36.6 C)  SpO2: 99%  Weight: 195 lb  (88.5 kg)  Height: 5\' 3"  (1.6 m)   Body mass index is 34.54 kg/m.  Physical Exam HENT:     Head: Normocephalic.     Nose:     Comments: Wearing a mask Eyes:     Pupils: Pupils are equal, round, and reactive to light.  Cardiovascular:     Rate and Rhythm: Normal rate.     Pulses: Normal pulses.  Pulmonary:     Effort: Pulmonary effort is normal.  Abdominal:     General: Abdomen is flat.     Palpations: Abdomen is soft.  Musculoskeletal:     Comments: Prominent varicosity left posterior thigh Spider veins bilateral legs in various places  Skin:    General: Skin is warm and dry.     Capillary Refill: Capillary refill takes less than 2 seconds.  Neurological:     General: No focal deficit present.     Mental Status: She is alert.  Psychiatric:        Mood and Affect: Mood normal.        Behavior: Behavior normal.        Thought Content: Thought content normal.        Judgment: Judgment normal.     Data: Venous Reflux Times  +---------------------+---------+------+-----------+------------+----------  ----+  RIGHT        Reflux NoRefluxReflux TimeDiameter cmsComments                       Yes                       +---------------------+---------+------+-----------+------------+----------  ----+  CFV         no                             +---------------------+---------+------+-----------+------------+----------  ----+  FV mid        no                             +---------------------+---------+------+-----------+------------+----------  ----+  Popliteal      no                             +---------------------+---------+------+-----------+------------+----------  ----+  GSV at [facility]      no               0.70             +---------------------+---------+------+-----------+------------+----------  ----+  GSV prox thigh  not  visualized  +---------------------+---------+------+-----------+------------+----------  ----+  GSV mid thigh                        not  visualized  +---------------------+---------+------+-----------+------------+----------  ----+  GSV dist thigh                        not  visualized  +---------------------+---------+------+-----------+------------+----------  ----+  GSV at knee                         not  visualized  +---------------------+---------+------+-----------+------------+----------  ----+  GSV prox calf                        not  visualized  +---------------------+---------+------+-----------+------------+----------  ----+  GSV mid calf     no               0.23            +---------------------+---------+------+-----------+------------+----------  ----+  SSV Pop Fossa    no               0.52            +---------------------+---------+------+-----------+------------+----------  ----+  SSV prox calf    no               0.36            +---------------------+---------+------+-----------+------------+----------  ----+  SSV mid calf           yes  >500 ms   0.28            +---------------------+---------+------+-----------+------------+----------  ----+  Anterior accessory        yes  >500 ms   0.51            proximal thigh                                  +---------------------+---------+------+-----------+------------+----------  ----+      +---------------------+---------+------+-----------+------------+----------  ----+  LEFT         Reflux NoRefluxReflux TimeDiameter cmsComments                       Yes                       +---------------------+---------+------+-----------+------------+----------  ----+  CFV         no                             +---------------------+---------+------+-----------+------------+----------  ----+  FV mid        no                             +---------------------+---------+------+-----------+------------+----------  ----+  Popliteal      no                             +---------------------+---------+------+-----------+------------+----------  ----+  GSV at [facility]            yes  >500 ms   0.71            +---------------------+---------+------+-----------+------------+----------  ----+  GSV prox thigh  0.38            +---------------------+---------+------+-----------+------------+----------  ----+  GSV mid thigh                        not  visualized  +---------------------+---------+------+-----------+------------+----------  ----+  GSV dist thigh                        not  visualized  +---------------------+---------+------+-----------+------------+----------  ----+  GSV at knee                         not  visualized  +---------------------+---------+------+-----------+------------+----------  ----+  GSV prox calf                        not  visualized  +---------------------+---------+------+-----------+------------+----------  ----+  GSV mid calf           yes  >500 ms    0.30            +---------------------+---------+------+-----------+------------+----------  ----+  GSV dist calf          yes  >500 ms                 +---------------------+---------+------+-----------+------------+----------  ----+  SSV Pop Fossa                        not  visualized  +---------------------+---------+------+-----------+------------+----------  ----+  SSV prox calf          yes  >500 ms   0.35            +---------------------+---------+------+-----------+------------+----------  ----+  SSV mid calf     no               0.29            +---------------------+---------+------+-----------+------------+----------  ----+  Anterior accessory        yes  >500 ms                 saphenous vein                                  proximal thigh                                  +---------------------+---------+------+-----------+------------+----------  ----+         Summary:  Bilateral:  - No evidence of deep vein thrombosis seen in the lower extremities,  bilaterally, from the common femoral through the popliteal veins.  - No evidence of superficial venous thrombosis in the lower extremities,  bilaterally.    Right:  - Venous reflux is noted in the right short saphenous vein.  - Venous reflux is noted in the right anterior accessory vein in the  thigh.    Left:  - Venous reflux is noted in the left sapheno-femoral junction.  - Venous reflux is noted in the left greater saphenous vein in the calf.  - Venous reflux is noted in the left short saphenous vein.  - Venous reflux is noted in the left anterior accessory vein in the thigh.        Assessment/Plan:    50 year old female with mostly spider telangiectasias but  some varicosities.  She has C2 venous disease with anterior reflux  notable but previous saphenous veins have been treated.  She is considering spider vein treatment as well as injection of the varicosity.  She discussed with [name] today and will call to schedule.     Maeola Harman MD [facility]

## 2021-02-07 ENCOUNTER — Ambulatory Visit: Payer: 59

## 2021-04-13 ENCOUNTER — Other Ambulatory Visit: Payer: Self-pay | Admitting: Cardiology

## 2021-04-13 DIAGNOSIS — I10 Essential (primary) hypertension: Secondary | ICD-10-CM

## 2021-04-20 ENCOUNTER — Encounter: Payer: Self-pay | Admitting: Neurology

## 2021-07-05 ENCOUNTER — Telehealth: Payer: Self-pay | Admitting: Cardiology

## 2021-07-05 DIAGNOSIS — I10 Essential (primary) hypertension: Secondary | ICD-10-CM

## 2021-07-05 MED ORDER — VALSARTAN-HYDROCHLOROTHIAZIDE 320-25 MG PO TABS: 1.0000 | ORAL_TABLET | Freq: Every day | ORAL | 0 refills | Status: DC

## 2021-07-05 MED ORDER — POTASSIUM CHLORIDE ER 10 MEQ PO TBCR
10.0000 meq | EXTENDED_RELEASE_TABLET | Freq: Every day | ORAL | 0 refills | Status: DC
Start: 2021-07-05 — End: 2021-07-21

## 2021-07-05 NOTE — Telephone Encounter (Signed)
*  STAT* If patient is at the pharmacy, call can be transferred to refill team.   1. Which medications need to be refilled? (please list name of each medication and dose if known)  valsartan-hydrochlorothiazide (DIOVAN-HCT) 320-25 MG tablet potassium chloride (KLOR-CON) 10 MEQ tablet  2. Which pharmacy/location (including street and city if local pharmacy) is medication to be sent to? [facility] - [place], [place] - [address]  3. Do they need a 30 day or 90 day supply? 90 day

## 2021-07-05 NOTE — Telephone Encounter (Signed)
Medication refill request approved for Diovan- HCT 320-25 mg tablets and Potassium Chloride 10 mEq tablets for 30 days. Pt needs follow up appt for further refills.

## 2021-07-07 NOTE — Progress Notes (Signed)
NEUROLOGY CONSULTATION NOTE  NOVICE VRBA MRN: [id number] DOB: 08-04-1971  Referring provider: Farris Has, MD Primary care provider: Farris Has, MD  Reason for consult:  weakness  Assessment/Plan:   Thoracic and lumbar back pain - I do not suspect a neurologic etiology Right upper extremity numbness, suspect carpal tunnel syndrome  1  NCV-EMG of right upper extremity.  Further recommendations pending results 2  Follow up with PCP regarding back pain   Subjective:  [name] is a 50 year old female with HTN, asthma, fibromyalgia, OSA, migraines and nonischemic cardiomyopathy who presents for weakness.  History supplemented by referring provider's note.  For the past 6 months, she reports severe back pain while performing household chores or just walking.  It is an aching and burning pain down the spine from between the shoulder blades to lumbar region.  No weakness, numbness or radicular pain down the legs.  X-rays of the thoracic and lumbar spine on 06/06/2021 personally reviewed showed mild compression of the L1 vertebral body but otherwise unremarkable.  She went to Urgent Care who prescribed her meloxicam, which helps.  She also reports that her right arm is numb, from the elbow down to the middle three fingers.  It wakes her up at night.  Sometimes the third finger turns white as well.  No new neck pain, radicular pain or weakness.  She does have fibromyalgia.   PAST MEDICAL HISTORY: Past Medical History:  Diagnosis Date   Asthma    Essential hypertension    Fibromyalgia    History of cardiomyopathy    Nonischemic 2011, LVEF 45-50%   OSA (obstructive sleep apnea)     PAST SURGICAL HISTORY: Past Surgical History:  Procedure Laterality Date   CESAREAN SECTION     HUMERUS FRACTURE SURGERY Left    LIPOSUCTION     Skin cyst removal     TONSILLECTOMY AND ADENOIDECTOMY     TYMPANOTOMY      MEDICATIONS: Current Outpatient Medications on File Prior to  Visit  Medication Sig Dispense Refill   ALPRAZolam (XANAX) 0.25 MG tablet Take up to 2 tablets by mouth every 4 hours prn. 30 tablet 0   DULoxetine (CYMBALTA) 30 MG capsule Take 30 mg by mouth daily.     estradiol-norethindrone (ACTIVELLA) 1-0.5 MG tablet Take 1 tablet by mouth at bedtime. (Patient not taking: Reported on 01/21/2021)     furosemide (LASIX) 10 MG/ML solution Take by mouth as needed.     lansoprazole (PREVACID) 15 MG capsule Take 30 mg by mouth in the morning and at bedtime.     potassium chloride (KLOR-CON) 10 MEQ tablet Take 1 tablet (10 mEq total) by mouth daily. 30 tablet 0   SUMAtriptan (IMITREX) 100 MG tablet Take 100 mg by mouth every 2 (two) hours as needed.     valsartan-hydrochlorothiazide (DIOVAN-HCT) 320-25 MG tablet Take 1 tablet by mouth daily. 30 tablet 0   zolpidem (AMBIEN) 10 MG tablet Take 10 mg by mouth at bedtime as needed.     zonisamide (ZONEGRAN) 100 MG capsule Take 1 capsule (100 mg total) by mouth daily. 30 capsule 0   No current facility-administered medications on file prior to visit.    ALLERGIES: Allergies  Allergen Reactions   Bupropion     Other reaction(s): HA, nausea   Buspirone Hcl     Other reaction(s): irritability   Citalopram Hydrobromide     Other reaction(s): irritability   Gabapentin     Other reaction(s): swelling &  dizziness   Iodine-131 Other (See Comments)    other   Iohexol Hives   Venlafaxine     Other reaction(s): Hair Loss    FAMILY HISTORY: Family History  Problem Relation Age of Onset   Emphysema Father        Tobacco use   Peripheral Artery Disease Father    Emphysema Paternal Grandmother        Tobacco use   Rheum arthritis Paternal Grandmother    Diabetes Paternal Grandmother    Hypertension Paternal Grandmother    Fibromyalgia Paternal Grandmother    COPD Paternal Grandmother    Asthma Brother    Lung cancer Paternal Grandfather        Tobacco use   Cancer - Lung Paternal Grandfather    Atrial  fibrillation Mother    Aneurysm Maternal Grandmother     Objective:  Blood pressure (!) 141/65, pulse 95, height 5\' 3"  (1.6 m), weight 186 lb 6.4 oz (84.6 kg), SpO2 98 %. General: No acute distress.  Patient appears well-groomed.   Head:  Normocephalic/atraumatic Eyes:  fundi examined but not visualized Neck: supple, no paraspinal tenderness, full range of motion Back: No paraspinal tenderness Heart: regular rate and rhythm Lungs: Clear to auscultation bilaterally. Vascular: No carotid bruits. Neurological Exam: Mental status: alert and oriented to person, place, and time, recent and remote memory intact, fund of knowledge intact, attention and concentration intact, speech fluent and not dysarthric, language intact. Cranial nerves: CN I: not tested CN II: pupils equal, round and reactive to light, visual fields intact CN III, IV, VI:  full range of motion, no nystagmus, no ptosis CN V: facial sensation intact. CN VII: upper and lower face symmetric CN VIII: hearing intact CN IX, X: gag intact, uvula midline CN XI: sternocleidomastoid and trapezius muscles intact CN XII: tongue midline Bulk & Tone: normal, no fasciculations. Motor:  muscle strength 5/5 throughout Sensation:  Pinprick, temperature and vibratory sensation intact. Deep Tendon Reflexes:  2+ throughout,  toes downgoing.   Finger to nose testing:  Without dysmetria.   Heel to shin:  Without dysmetria.   Gait:  Normal station and stride.  Romberg negative. Tinel sign:  positive at right wrist.   Thank you for allowing me to take part in the care of this patient.  , DO  CC: Shon Millet, MD

## 2021-07-08 ENCOUNTER — Ambulatory Visit: Payer: Managed Care, Other (non HMO) | Admitting: Neurology

## 2021-07-08 ENCOUNTER — Other Ambulatory Visit: Payer: Self-pay

## 2021-07-08 VITALS — BP 141/65 | HR 95 | Ht 63.0 in | Wt 186.4 lb

## 2021-07-08 DIAGNOSIS — G8929 Other chronic pain: Secondary | ICD-10-CM

## 2021-07-08 DIAGNOSIS — M546 Pain in thoracic spine: Secondary | ICD-10-CM | POA: Diagnosis not present

## 2021-07-08 DIAGNOSIS — R200 Anesthesia of skin: Secondary | ICD-10-CM | POA: Diagnosis not present

## 2021-07-08 DIAGNOSIS — M545 Low back pain, unspecified: Secondary | ICD-10-CM

## 2021-07-08 NOTE — Patient Instructions (Signed)
I don't think that the back pain is nerve related.  I would follow up with your PCP - he may want to consider sending you to a spine specialist, but I defer to him Will order a nerve study for the right arm.  Further recommendations pending results.

## 2021-07-11 ENCOUNTER — Encounter: Payer: Self-pay | Admitting: Neurology

## 2021-07-20 ENCOUNTER — Other Ambulatory Visit: Payer: Self-pay | Admitting: Cardiology

## 2021-07-20 DIAGNOSIS — I10 Essential (primary) hypertension: Secondary | ICD-10-CM

## 2021-08-04 ENCOUNTER — Encounter: Payer: Managed Care, Other (non HMO) | Admitting: Neurology

## 2021-08-10 ENCOUNTER — Other Ambulatory Visit: Payer: Self-pay | Admitting: Family Medicine

## 2021-08-10 DIAGNOSIS — S22000S Wedge compression fracture of unspecified thoracic vertebra, sequela: Secondary | ICD-10-CM

## 2021-08-15 ENCOUNTER — Ambulatory Visit: Payer: 59

## 2021-08-16 ENCOUNTER — Ambulatory Visit: Payer: Self-pay

## 2021-08-17 NOTE — Progress Notes (Signed)
Cardiology Office Note    Date:  08/18/2021   ID:  [name], DOB 1971-07-30, MRN [id number]  PCP:  Farris Has, MD  Cardiologist: Nona Dell, MD   EP: Dr. Elberta Fortis  Chief Complaint  Patient presents with   Follow-up    Annual Visit    History of Present Illness:    [name] is a 50 y.o. female with past medical history of HTN and prior presumed NICM (EF at 45-50% in 2011 and felt possibly secondary to viral illness, normalized by repeat imaging in 06/2020) who presents to the office today for annual follow-up.  She was last examined by Dr. Diona Browner in 06/2020 and reported overall doing well at that time and denied any recent anginal symptoms. She was continued on her current cardiac medications and a follow-up echocardiogram was recommended for reassessment of her EF. This showed her EF had improved to 65 to 70% with no regional wall motion abnormalities. She did not have any significant valve abnormalities.  In talking with the patient today, she reports overall doing well from a cardiac perspective since her last office visit. She remains active at home and also works as a Higher education careers adviser at [employer]. She denies any recent chest pain or dyspnea on exertion. No recent orthopnea, PND or pitting edema. Reports occasional palpitations but typically only notices this with caffeine intake and denies any persistent symptoms or associated dizziness or presyncope. 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  Past Medical History:  Diagnosis Date   Asthma    Essential hypertension    Fibromyalgia    History of cardiomyopathy    Nonischemic 2011, LVEF 45-50%   OSA (obstructive sleep apnea)     Past Surgical History:  Procedure Laterality Date   CESAREAN SECTION     HUMERUS FRACTURE SURGERY Left    LIPOSUCTION     Skin cyst removal     TONSILLECTOMY AND ADENOIDECTOMY     TYMPANOTOMY      Current  Medications: Outpatient Medications Prior to Visit  Medication Sig Dispense Refill   ALPRAZolam (XANAX) 0.25 MG tablet Take up to 2 tablets by mouth every 4 hours prn. 30 tablet 0   DULoxetine (CYMBALTA) 30 MG capsule Take 30 mg by mouth daily.     furosemide (LASIX) 10 MG/ML solution Take by mouth as needed.     lansoprazole (PREVACID) 15 MG capsule Take 30 mg by mouth in the morning and at bedtime.     meloxicam (MOBIC) 15 MG tablet Take 1 tablet by mouth daily.     SUMAtriptan (IMITREX) 100 MG tablet Take 100 mg by mouth every 2 (two) hours as needed.     zolpidem (AMBIEN) 10 MG tablet Take 10 mg by mouth at bedtime as needed.     zonisamide (ZONEGRAN) 100 MG capsule Take 1 capsule (100 mg total) by mouth daily. 30 capsule 0   potassium chloride (KLOR-CON) 10 MEQ tablet TAKE 1 TABLET DAILY (NEED FOLLOW UP APPOINTMENT FOR FURTHER REFILLS) 15 tablet 0   valsartan-hydrochlorothiazide (DIOVAN-HCT) 320-25 MG tablet TAKE 1 TABLET DAILY (NEED FOLLOW UP APPOINTMENT FOR FURTHER REFILLS) 15 tablet 0   estradiol-norethindrone (ACTIVELLA) 1-0.5 MG tablet Take 1 tablet by mouth at bedtime. (Patient not taking: Reported on 01/21/2021)     No facility-administered medications prior to visit.     Allergies:   Bupropion, Buspirone hcl, Citalopram hydrobromide, Gabapentin, Iodine-131, Iohexol, and Venlafaxine   Social History  Socioeconomic History   Marital status: Married    Spouse name: Not on file   Number of children: 1   Years of education: Not on file   Highest education level: Not on file  Occupational History   Occupation: RN  Tobacco Use   Smoking status: Never   Smokeless tobacco: Never  Vaping Use   Vaping Use: Never used  Substance and Sexual Activity   Alcohol use: Yes    Comment: Socially   Drug use: No   Sexual activity: Not on file  Other Topics Concern   Not on file  Social History Narrative   Not on file   Social Determinants of Health   Financial Resource Strain:  Not on file  Food Insecurity: Not on file  Transportation Needs: Not on file  Physical Activity: Not on file  Stress: Not on file  Social Connections: Not on file     Family History:  The patient's family history includes Aneurysm in her maternal grandmother; Asthma in her brother; Atrial fibrillation in her mother; COPD in her paternal grandmother; Cancer - Lung in her paternal grandfather; Diabetes in her paternal grandmother; Emphysema in her father and paternal grandmother; Fibromyalgia in her paternal grandmother; Hypertension in her paternal grandmother; Lung cancer in her paternal grandfather; Peripheral Artery Disease in her father; Rheum arthritis in her paternal grandmother.   Review of Systems:    Please see the history of present illness.     All other systems reviewed and are otherwise negative except as noted above.   Physical Exam:    VS:  BP 122/70    Pulse 78    Ht 5\' 3"  (1.6 m)    Wt 185 lb 6.4 oz (84.1 kg)    SpO2 99%    BMI 32.84 kg/m    General: Well developed, well nourished,female appearing in no acute distress. Head: Normocephalic, atraumatic. Neck: No carotid bruits. JVD not elevated.  Lungs: Respirations regular and unlabored, without wheezes or rales.  Heart: Regular rate and rhythm. No S3 or S4.  No murmur, no rubs, or gallops appreciated. Abdomen: Appears non-distended. No obvious abdominal masses. Msk:  Strength and tone appear normal for age. No obvious joint deformities or effusions. Extremities: No clubbing or cyanosis. No pitting edema. Varicose veins noted.  Neuro: Alert and oriented X 3. Moves all extremities spontaneously. No focal deficits noted. Psych:  Responds to questions appropriately with a normal affect. Skin: No rashes or lesions noted  Wt Readings from Last 3 Encounters:  08/18/21 185 lb 6.4 oz (84.1 kg)  07/11/21 186 lb 6.4 oz (84.6 kg)  01/21/21 195 lb (88.5 kg)     Studies/Labs Reviewed:   EKG:  EKG is ordered today.  The ekg  ordered today demonstrates NSR, HR 73 with no acute ST abnormalities.   Recent Labs: No results found for requested labs within last 8760 hours.   Lipid Panel No results found for: CHOL, TRIG, HDL, CHOLHDL, VLDL, LDLCALC, LDLDIRECT  Additional studies/ records that were reviewed today include:   Echocardiogram: 06/2020 IMPRESSIONS     1. Left ventricular ejection fraction, by estimation, is 65 to 70%. The  left ventricle has normal function. The left ventricle has no regional  wall motion abnormalities. Left ventricular diastolic parameters are  indeterminate.   2. Right ventricular systolic function is normal. The right ventricular  size is normal.   3. The mitral valve is normal in structure. No evidence of mitral valve  regurgitation. No evidence of  mitral stenosis.   4. The aortic valve is tricuspid. Aortic valve regurgitation is not  visualized. No aortic stenosis is present.   5. The inferior vena cava is normal in size with greater than 50%  respiratory variability, suggesting right atrial pressure of 3 mmHg.   Assessment:    1. Essential hypertension   2. History of cardiomyopathy      Plan:   In order of problems listed above:  1. HTN - Her blood pressure is well-controlled at 122/70 during today's visit. Will continue her current medication regimen with Valsartan-HCTZ 320-25 mg daily along with associated potassium supplementation. She does report having recent labs with her PCP and I will request a copy of these.  2. History of Cardiomyopathy - Her EF was previously at 45-50% in 2011 and felt possibly secondary to viral illness at that time, normalized by repeat imaging in 06/2020. No recent respiratory issues to suggest recurrence at this time   Medication Adjustments/Labs and Tests Ordered: Current medicines are reviewed at length with the patient today.  Concerns regarding medicines are outlined above.  Medication changes, Labs and Tests ordered today are  listed in the Patient Instructions below. Patient Instructions  Medication Instructions:  Your physician recommends that you continue on your current medications as directed. Please refer to the Current Medication list given to you today.   Labwork: None today   Testing/Procedures: None today   Follow-Up: 1 year  Any Other Special Instructions Will Be Listed Below (If Applicable).  If you need a refill on your cardiac medications before your next appointment, please call your pharmacy.   Signed, Ellsworth Lennox, PA-C  08/18/2021 5:00 PM    [facility] [address] [place], [place] [address] Phone: [phone] Fax: [phone]

## 2021-08-18 ENCOUNTER — Other Ambulatory Visit: Payer: Self-pay

## 2021-08-18 ENCOUNTER — Ambulatory Visit: Payer: Managed Care, Other (non HMO) | Admitting: Student

## 2021-08-18 ENCOUNTER — Encounter: Payer: Self-pay | Admitting: Student

## 2021-08-18 VITALS — BP 122/70 | HR 78 | Ht 63.0 in | Wt 185.4 lb

## 2021-08-18 DIAGNOSIS — Z8679 Personal history of other diseases of the circulatory system: Secondary | ICD-10-CM | POA: Diagnosis not present

## 2021-08-18 DIAGNOSIS — I10 Essential (primary) hypertension: Secondary | ICD-10-CM

## 2021-08-18 MED ORDER — VALSARTAN-HYDROCHLOROTHIAZIDE 320-25 MG PO TABS: 1.0000 | ORAL_TABLET | Freq: Every day | ORAL | 3 refills | Status: DC

## 2021-08-18 MED ORDER — POTASSIUM CHLORIDE ER 10 MEQ PO TBCR: 10.0000 meq | EXTENDED_RELEASE_TABLET | Freq: Every day | ORAL | 3 refills | Status: DC

## 2021-08-18 NOTE — Patient Instructions (Signed)
Medication Instructions:  Your physician recommends that you continue on your current medications as directed. Please refer to the Current Medication list given to you today.   Labwork: None today  Testing/Procedures: None today  Follow-Up: 1 year  Any Other Special Instructions Will Be Listed Below (If Applicable).  If you need a refill on your cardiac medications before your next appointment, please call your pharmacy.  

## 2021-08-22 ENCOUNTER — Other Ambulatory Visit: Payer: Managed Care, Other (non HMO)

## 2021-09-22 ENCOUNTER — Other Ambulatory Visit: Payer: Self-pay | Admitting: Orthopedic Surgery

## 2021-09-22 ENCOUNTER — Other Ambulatory Visit: Payer: Managed Care, Other (non HMO)

## 2021-09-22 DIAGNOSIS — N2889 Other specified disorders of kidney and ureter: Secondary | ICD-10-CM

## 2021-10-06 ENCOUNTER — Other Ambulatory Visit: Payer: Managed Care, Other (non HMO)

## 2021-10-07 ENCOUNTER — Ambulatory Visit: Payer: Managed Care, Other (non HMO)

## 2021-10-14 ENCOUNTER — Ambulatory Visit: Payer: Managed Care, Other (non HMO)

## 2021-10-14 ENCOUNTER — Other Ambulatory Visit: Payer: Self-pay

## 2021-10-14 DIAGNOSIS — I8393 Asymptomatic varicose veins of bilateral lower extremities: Secondary | ICD-10-CM

## 2021-10-14 NOTE — Progress Notes (Signed)
Treat pt's spider and small reticular veins of LLE with Asclera 1% administered with a 27g butterfly.  Patient received a total of 2cc. Easy access; anticipate good results. Pt tolerated very well. Post treatment care instructions given both verbally and on hand. Large varicosity on LUE not treated. Pictures taken to discuss best treatment options. Pt and I discussed this would need to be shown to MD. Will f/u PRN.  Photos: Yes.    Compression stockings applied: Yes.

## 2021-10-17 ENCOUNTER — Ambulatory Visit
Admission: RE | Admit: 2021-10-17 | Discharge: 2021-10-17 | Disposition: A | Discharge status: Home / Self Care | Payer: Managed Care, Other (non HMO) | Source: Ambulatory Visit | Attending: Orthopedic Surgery | Admitting: Orthopedic Surgery

## 2021-10-17 ENCOUNTER — Other Ambulatory Visit: Payer: Self-pay

## 2021-10-17 DIAGNOSIS — N2889 Other specified disorders of kidney and ureter: Secondary | ICD-10-CM

## 2021-10-19 ENCOUNTER — Encounter: Payer: Self-pay | Admitting: Vascular Surgery

## 2021-10-19 ENCOUNTER — Other Ambulatory Visit: Payer: Self-pay

## 2021-10-19 ENCOUNTER — Ambulatory Visit: Payer: Managed Care, Other (non HMO) | Admitting: Vascular Surgery

## 2021-10-19 VITALS — BP 148/83 | HR 100 | Temp 98.0°F | Resp 14 | Ht 63.0 in | Wt 178.0 lb

## 2021-10-19 DIAGNOSIS — I8393 Asymptomatic varicose veins of bilateral lower extremities: Secondary | ICD-10-CM

## 2021-10-19 DIAGNOSIS — I83893 Varicose veins of bilateral lower extremities with other complications: Secondary | ICD-10-CM

## 2021-10-19 NOTE — Progress Notes (Signed)
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  She has been elevating her legs and wearing knee-high compression stockings.  We have also discussed the importance of trying to avoid prolonged sitting and standing, exercise, and nutrition.  I have recommended that she use her thigh-high pantyhose stockings which she has at home to see if these help with her symptoms.  If these are not effective I think she would be a candidate for 10-20 stab phlebectomies of the left leg for the painful varicose veins of her left thigh.  We have discussed the indications for the procedure and the potential complications.   HPI:   [name] is a pleasant 51 y.o. 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  She was being considered for sclerotherapy however it was felt that the veins were too large for this and therefore she is sent back to be considered for stab phlebectomies.  On my history she has had these varicose veins for many months now.  She describes aching pain heaviness in the left thigh that is aggravated by standing and sitting and relieved with elevation.  Her symptoms are worse at the end of the day.  Past Medical History:  Diagnosis Date   Asthma    Essential hypertension    Fibromyalgia    History of cardiomyopathy    Nonischemic 2011, LVEF 45-50%   OSA (obstructive sleep apnea)     Family History  Problem Relation Age of Onset   Emphysema Father        Tobacco use   Peripheral Artery Disease Father    Emphysema Paternal Grandmother        Tobacco use   Rheum arthritis Paternal Grandmother    Diabetes Paternal Grandmother    Hypertension Paternal Grandmother    Fibromyalgia Paternal Grandmother    COPD Paternal Grandmother    Asthma Brother     Lung cancer Paternal Grandfather        Tobacco use   Cancer - Lung Paternal Grandfather    Atrial fibrillation Mother    Aneurysm Maternal Grandmother     SOCIAL HISTORY: Social History   Tobacco Use   Smoking status: Never   Smokeless tobacco: Never  Substance Use Topics   Alcohol use: Yes    Comment: Socially    Allergies  Allergen Reactions   Bupropion     Other reaction(s): HA, nausea   Buspirone Hcl     Other reaction(s): irritability   Citalopram Hydrobromide     Other reaction(s): irritability   Gabapentin     Other reaction(s): swelling & dizziness   Iodine-131 Other (See Comments)    other   Iohexol Hives   Venlafaxine     Other reaction(s): Hair Loss    Current Outpatient Medications  Medication Sig Dispense Refill   ALPRAZolam (XANAX) 0.25 MG tablet Take up to 2 tablets by mouth every 4 hours prn. 30 tablet 0   DULoxetine (CYMBALTA) 30 MG capsule Take 30 mg by mouth daily.     furosemide (LASIX) 10 MG/ML solution Take by mouth as needed.     lansoprazole (PREVACID) 15 MG capsule Take 30 mg by mouth in the morning and at bedtime.     meloxicam (MOBIC) 15 MG tablet Take  1 tablet by mouth daily.     potassium chloride (KLOR-CON) 10 MEQ tablet Take 1 tablet (10 mEq total) by mouth daily. 90 tablet 3   SUMAtriptan (IMITREX) 100 MG tablet Take 100 mg by mouth every 2 (two) hours as needed.     valsartan-hydrochlorothiazide (DIOVAN-HCT) 320-25 MG tablet Take 1 tablet by mouth daily. 90 tablet 3   zolpidem (AMBIEN) 10 MG tablet Take 10 mg by mouth at bedtime as needed.     zonisamide (ZONEGRAN) 100 MG capsule Take 1 capsule (100 mg total) by mouth daily. 30 capsule 0   estradiol-norethindrone (ACTIVELLA) 1-0.5 MG tablet Take 1 tablet by mouth at bedtime. (Patient not taking: Reported on 01/21/2021)     No current facility-administered medications for this visit.    REVIEW OF SYSTEMS:  [X]  denotes positive finding, [ ]  denotes negative finding Cardiac   Comments:  Chest pain or chest pressure:    Shortness of breath upon exertion:    Short of breath when lying flat:    Irregular heart rhythm:        Vascular    Pain in calf, thigh, or hip brought on by ambulation:    Pain in feet at night that wakes you up from your sleep:     Blood clot in your veins:    Leg swelling:         Pulmonary    Oxygen at home:    Productive cough:     Wheezing:         Neurologic    Sudden weakness in arms or legs:     Sudden numbness in arms or legs:     Sudden onset of difficulty speaking or slurred speech:    Temporary loss of vision in one eye:     Problems with dizziness:         Gastrointestinal    Blood in stool:     Vomited blood:         Genitourinary    Burning when urinating:     Blood in urine:        Psychiatric    Major depression:         Hematologic    Bleeding problems:    Problems with blood clotting too easily:        Skin    Rashes or ulcers:        Constitutional    Fever or chills:     PHYSICAL EXAM:   Vitals:   10/19/21 1436  BP: (!) 148/83  Pulse: 100  Resp: 14  Temp: 98 F (36.7 C)  TempSrc: Temporal  SpO2: 98%  Weight: 178 lb (80.7 kg)  Height: 5\' 3"  (1.6 m)    GENERAL: The patient is a well-nourished female, in no acute distress. The vital signs are documented above. CARDIAC: There is a regular rate and rhythm.  VASCULAR: I do not detect carotid bruits. She has palpable pedal pulses. She has enlarged varicose veins along the anterior lateral aspect of her left thigh.   I did look at her anterior accessory saphenous vein myself with the SonoSite and I did not identify an enlarged anterior accessory saphenous vein.  There is a remnant of the great saphenous vein above her previous ablation which is likely feeding these varicosities although this segment is fairly short and I do not think is amenable to laser ablation. PULMONARY: There is good air exchange bilaterally without wheezing or  rales.  MUSCULOSKELETAL: There are no  major deformities or cyanosis. NEUROLOGIC: No focal weakness or paresthesias are detected. SKIN: There are no ulcers or rashes noted. PSYCHIATRIC: The patient has a normal affect.  DATA:    VENOUS DUPLEX: I reviewed her venous duplex scan from 01/13/2021.  On the left side, which is the side of concern, there is no evidence of DVT.  There is no deep venous reflux.  The left great saphenous vein has been previously ablated.  There is a short segment of reflux in the small saphenous vein but the vein is not dilated.  There is a short segment of reflux in the great saphenous vein in the distal cap of the vein is not dilated.  Waverly Ferrari [facility] [phone]

## 2021-12-09 ENCOUNTER — Telehealth: Payer: Self-pay | Admitting: Student

## 2021-12-09 DIAGNOSIS — I10 Essential (primary) hypertension: Secondary | ICD-10-CM

## 2021-12-09 MED ORDER — VALSARTAN-HYDROCHLOROTHIAZIDE 320-25 MG PO TABS: 1.0000 | ORAL_TABLET | Freq: Every day | ORAL | 3 refills | Status: DC

## 2021-12-09 MED ORDER — POTASSIUM CHLORIDE ER 10 MEQ PO TBCR: 10.0000 meq | EXTENDED_RELEASE_TABLET | Freq: Every day | ORAL | 3 refills | Status: DC

## 2021-12-09 NOTE — Telephone Encounter (Signed)
Cmpleted

## 2021-12-09 NOTE — Telephone Encounter (Signed)
? ? ?*  STAT* If patient is at the pharmacy, call can be transferred to refill team. ? ? ?1. Which medications need to be refilled? (please list name of each medication and dose if known) valsartan-hydrochlorothiazide (DIOVAN-HCT) 320-25 MG tablet ? potassium chloride (KLOR-CON) 10 MEQ tablet  ? ? ?2. Which pharmacy/location (including street and city if local pharmacy) is medication to be sent to? [facility] ? ?3. Do they need a 30 day or 90 day supply? 90 days ? ?

## 2021-12-12 DIAGNOSIS — M545 Low back pain, unspecified: Secondary | ICD-10-CM | POA: Diagnosis not present

## 2022-01-06 DIAGNOSIS — R220 Localized swelling, mass and lump, head: Secondary | ICD-10-CM | POA: Diagnosis not present

## 2022-01-18 ENCOUNTER — Encounter: Payer: Self-pay | Admitting: Vascular Surgery

## 2022-01-18 ENCOUNTER — Ambulatory Visit: Payer: BC Managed Care – PPO | Admitting: Vascular Surgery

## 2022-01-18 ENCOUNTER — Telehealth: Payer: Self-pay | Admitting: Cardiology

## 2022-01-18 VITALS — BP 150/82 | HR 93 | Temp 98.3°F | Resp 14 | Ht 63.0 in | Wt 176.0 lb

## 2022-01-18 DIAGNOSIS — I83813 Varicose veins of bilateral lower extremities with pain: Secondary | ICD-10-CM | POA: Diagnosis not present

## 2022-01-18 NOTE — Telephone Encounter (Signed)
? ?  Please verify with the patient that she meant angioedema in regards to facial swelling? If so, would add Valsartan to her allergy list.  ? ?Can provide an Rx for HCTZ 25mg  daily alone as she had been on a combination pill of Valsartan-HCTZ. Would start Amlodipine 5mg  daily to replace Valsartan as this is not an ACE-I or ARB.  ? ?Signed, ? , PA-C ?01/18/2022, 4:14 PM ?Pager: [phone] ? ?

## 2022-01-18 NOTE — Telephone Encounter (Signed)
Pt c/o swelling: STAT is pt has developed SOB within 24 hours ? ?If swelling, where is the swelling located? In face and legs, mainly face ? ?How much weight have you gained and in what time span? Has not gained weight ? ?Have you gained 3 pounds in a day or 5 pounds in a week? no ? ?Do you have a log of your daily weights (if so, list)? no ? ?Are you currently taking a fluid pill? yes ? ?Are you currently SOB? no ? ?Have you traveled recently? no ? ? ?Patient states she has swelling in her face and legs. ?

## 2022-01-18 NOTE — Telephone Encounter (Signed)
Pt stated she stopped taking the Valsartan and her angioedema has significantly gone down. Pt stopped medication 3 days ago. Pt would like to stop arb. Pt went to another provider to discuss other issues and bp was 158/89 at that visit.  ?

## 2022-01-18 NOTE — Telephone Encounter (Signed)
Patient was returning call. Please advise ?

## 2022-01-18 NOTE — Telephone Encounter (Signed)
Left a message for pt to call office back.  °

## 2022-01-18 NOTE — Progress Notes (Signed)
? ? ?REASON FOR VISIT:  ? ?Follow-up of chronic venous insufficiency ? ?MEDICAL ISSUES:  ? 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  She has failed conservative treatment including leg elevation, thigh-high compression stockings with a gradient of 20 to 30 mmHg, and exercise.  Given her persistent symptoms I think she would be a good candidate for 10-20 stabs for her varicose veins of the left thigh.  In addition I think she would benefit from 2 units of sclerotherapy given her telangiectasias on her lateral and medial left leg.  We will try to schedule this in the near future. ? ? ?HPI:  ? ?[name] is a pleasant 51 y.o. female who I saw on 10/19/2021 with chronic venous insufficiency.  She had previously been seen in our office with painful varicose veins of the left thigh.  She was being considered for sclerotherapy however the veins were on the large side so she was seen to evaluate her for possible stab phlebectomies.  She was having significant aching pain and heaviness in the left thigh related to her varicosities.  She had been elevating her legs and wearing compression stockings.  We discussed importance of trying to avoid prolonged sitting and standing, exercise, and nutrition.  I recommended a thigh-high pantyhose style stocking with a gradient of 20-30.  If these were not effective I thought she would be a candidate for 10-20 stab phlebectomies of the left leg for these painful varicose veins.  Of note the previous duplex scan had suggested some incompetence of the left anterior accessory saphenous vein.  However I looked myself and did not identify an enlarged anterior accessory saphenous vein.  There was a remnant of the great saphenous vein above her previous ablation which was likely feeding these varicosities.  This segment however was fairly short and I did not think it was amenable to laser ablation. ? ?Since I saw her last, she  continues to have significant pain and aching in her left thigh where she has the enlarged varicose veins.  She has been wearing her thigh-high compression stockings but these do not help significantly.  She has been elevating her legs and trying to exercise. ?She denies any chest pain or shortness of breath. ? ? ?Past Medical History:  ?Diagnosis Date  ? Asthma   ? Essential hypertension   ? Fibromyalgia   ? History of cardiomyopathy   ? Nonischemic 2011, LVEF 45-50%  ? OSA (obstructive sleep apnea)   ? ? ?Family History  ?Problem Relation Age of Onset  ? Emphysema Father   ?     Tobacco use  ? Peripheral Artery Disease Father   ? Emphysema Paternal Grandmother   ?     Tobacco use  ? Rheum arthritis Paternal Grandmother   ? Diabetes Paternal Grandmother   ? Hypertension Paternal Grandmother   ? Fibromyalgia Paternal Grandmother   ? COPD Paternal Grandmother   ? Asthma Brother   ? Lung cancer Paternal Grandfather   ?     Tobacco use  ? Cancer - Lung Paternal Grandfather   ? Atrial fibrillation Mother   ? Aneurysm Maternal Grandmother   ? ? ?SOCIAL HISTORY: ?Social History  ? ?Tobacco Use  ? Smoking status: Never  ? Smokeless tobacco: Never  ?Substance Use Topics  ? Alcohol use: Yes  ?  Comment: Socially  ? ? ?Allergies  ?Allergen Reactions  ? Bupropion   ?  Other reaction(s): HA, nausea  ?  Buspirone Hcl   ?  Other reaction(s): irritability  ? Citalopram Hydrobromide   ?  Other reaction(s): irritability  ? Gabapentin   ?  Other reaction(s): swelling & dizziness  ? Iodine-131 Other (See Comments)  ?  other  ? Iohexol Hives  ? Venlafaxine   ?  Other reaction(s): Hair Loss  ? ? ?Current Outpatient Medications  ?Medication Sig Dispense Refill  ? ALPRAZolam (XANAX) 0.25 MG tablet Take up to 2 tablets by mouth every 4 hours prn. 30 tablet 0  ? DULoxetine (CYMBALTA) 30 MG capsule Take 30 mg by mouth daily.    ? estradiol-norethindrone (ACTIVELLA) 1-0.5 MG tablet Take 1 tablet by mouth at bedtime. (Patient not taking:  Reported on 01/21/2021)    ? furosemide (LASIX) 10 MG/ML solution Take by mouth as needed.    ? lansoprazole (PREVACID) 15 MG capsule Take 30 mg by mouth in the morning and at bedtime.    ? meloxicam (MOBIC) 15 MG tablet Take 1 tablet by mouth daily.    ? potassium chloride (KLOR-CON) 10 MEQ tablet Take 1 tablet (10 mEq total) by mouth daily. 90 tablet 3  ? SUMAtriptan (IMITREX) 100 MG tablet Take 100 mg by mouth every 2 (two) hours as needed.    ? valsartan-hydrochlorothiazide (DIOVAN-HCT) 320-25 MG tablet Take 1 tablet by mouth daily. 90 tablet 3  ? zolpidem (AMBIEN) 10 MG tablet Take 10 mg by mouth at bedtime as needed.    ? zonisamide (ZONEGRAN) 100 MG capsule Take 1 capsule (100 mg total) by mouth daily. 30 capsule 0  ? ?No current facility-administered medications for this visit.  ? ? ?REVIEW OF SYSTEMS:  ?[X]  denotes positive finding, [ ]  denotes negative finding ?Cardiac  Comments:  ?Chest pain or chest pressure:    ?Shortness of breath upon exertion:    ?Short of breath when lying flat:    ?Irregular heart rhythm:    ?    ?Vascular    ?Pain in calf, thigh, or hip brought on by ambulation:    ?Pain in feet at night that wakes you up from your sleep:     ?Blood clot in your veins:    ?Leg swelling:     ?    ?Pulmonary    ?Oxygen at home:    ?Productive cough:     ?Wheezing:     ?    ?Neurologic    ?Sudden weakness in arms or legs:     ?Sudden numbness in arms or legs:     ?Sudden onset of difficulty speaking or slurred speech:    ?Temporary loss of vision in one eye:     ?Problems with dizziness:     ?    ?Gastrointestinal    ?Blood in stool:     ?Vomited blood:     ?    ?Genitourinary    ?Burning when urinating:     ?Blood in urine:    ?    ?Psychiatric    ?Major depression:     ?    ?Hematologic    ?Bleeding problems:    ?Problems with blood clotting too easily:    ?    ?Skin    ?Rashes or ulcers:    ?    ?Constitutional    ?Fever or chills:    ? ?PHYSICAL EXAM:  ? ?Vitals:  ? 01/18/22 1301  ?Resp: 14   ?Weight: 176 lb (79.8 kg)  ?Height: 5\' 3"  (1.6 m)  ? ? ?GENERAL: The patient  is a well-nourished female, in no acute distress. The vital signs are documented above. ?CARDIAC: There is a regular rate and rhythm.  ?VASCULAR: The patient has dilated varicose veins along the lateral aspect of her left thigh as noted below. ? ? ?She has telangiectasias in the lateral and medial left leg. ? ? ? ? ?PULMONARY: There is good air exchange bilaterally without wheezing or rales. ?MUSCULOSKELETAL: There are no major deformities or cyanosis. ?NEUROLOGIC: No focal weakness or paresthesias are detected. ?SKIN: There are no ulcers or rashes noted. ?PSYCHIATRIC: The patient has a normal affect. ? ?DATA:   ? ?No new data ? ?Waverly Ferrari ?[facility] ?Office [phone] ?

## 2022-01-19 MED ORDER — HYDROCHLOROTHIAZIDE 25 MG PO TABS: 25.0000 mg | ORAL_TABLET | Freq: Every day | ORAL | 3 refills | Status: DC

## 2022-01-19 MED ORDER — AMLODIPINE BESYLATE 5 MG PO TABS: 5.0000 mg | ORAL_TABLET | Freq: Every day | ORAL | 3 refills | Status: DC

## 2022-01-19 NOTE — Telephone Encounter (Signed)
Patient notified and verbalized understanding. Pt agreeable with medication changes. Valsartan added to pt's allergy list.

## 2022-01-20 DIAGNOSIS — G4733 Obstructive sleep apnea (adult) (pediatric): Secondary | ICD-10-CM | POA: Diagnosis not present

## 2022-01-31 ENCOUNTER — Telehealth: Payer: Self-pay | Admitting: Cardiology

## 2022-01-31 NOTE — Telephone Encounter (Signed)
Pt c/o BP issue: STAT if pt c/o blurred vision, one-sided weakness or slurred speech  1. What are your last 5 BP readings?  190/89 - Yesterday  2. Are you having any other symptoms (ex. Dizziness, headache, blurred vision, passed out)? Headaches  3. What is your BP issue? Pt states that BP has been running high for the past couple of days. Please advise   Pt c/o medication issue:  1. Name of Medication:   amLODipine (NORVASC) 5 MG tablet    2. How are you currently taking this medication (dosage and times per day)?  Take 1 tablet (5 mg total) by mouth daily. 3. Are you having a reaction (difficulty breathing--STAT)? No  4. What is your medication issue? Pt states that she has been taking 2 tablets once daily due to BP running high. Pt states that she thinks dosage may need increasing. Please advise

## 2022-01-31 NOTE — Telephone Encounter (Signed)
    She was just started on Amlodipine 2 weeks ago after having stopped Valsartan. Can continue with Amlodipine 10mg  daily and if she has 5 mg tablets, would divide out to 5mg  BID. Max dose is 10mg  daily so cannot go beyond this. Make sure she is still taking HCTZ. Would follow BP for 1-2 weeks following dose adjustment of Amlodipine. If BP remains above goal, will switch HCTZ to Chlorthalidone.   Signed, , PA-C 01/31/2022, 6:46 PM

## 2022-01-31 NOTE — Telephone Encounter (Signed)
Pt returned call and states that she has been taking Amlodipine as prescribed. The last 2 day she has taken 10 mg instead of the 5 mg daily d/t high blood pressures. Pt reports that her BP has been running 180's / 70's-80's. This has been giving her headaches and blurred vision. Pt encouraged to be seen in the ER if she has blurred vision and headache. Please advise.

## 2022-01-31 NOTE — Telephone Encounter (Signed)
Attempt to reach patient,got voicemail,left message.

## 2022-02-01 MED ORDER — AMLODIPINE BESYLATE 5 MG PO TABS: 5.0000 mg | ORAL_TABLET | Freq: Two times a day (BID) | ORAL | 3 refills | Status: DC

## 2022-02-01 NOTE — Telephone Encounter (Signed)
Pt notified and order placed 

## 2022-02-02 ENCOUNTER — Other Ambulatory Visit: Payer: Self-pay | Admitting: *Deleted

## 2022-02-02 MED ORDER — LORAZEPAM 1 MG PO TABS: ORAL_TABLET | ORAL | 0 refills | Status: DC

## 2022-02-08 ENCOUNTER — Encounter: Payer: Self-pay | Admitting: Vascular Surgery

## 2022-02-08 ENCOUNTER — Ambulatory Visit: Payer: BC Managed Care – PPO | Admitting: Vascular Surgery

## 2022-02-08 VITALS — BP 144/94 | HR 113 | Temp 99.0°F | Resp 16 | Ht 63.0 in | Wt 175.0 lb

## 2022-02-08 DIAGNOSIS — I83812 Varicose veins of left lower extremities with pain: Secondary | ICD-10-CM | POA: Diagnosis not present

## 2022-02-08 HISTORY — PX: OTHER SURGICAL HISTORY: SHX169

## 2022-02-08 NOTE — Progress Notes (Signed)
   Patient name: [name] MRN: [id number] DOB: 1971/06/28 Sex: female  REASON FOR VISIT: For stab phlebectomies left leg varicose veins  HPI: [name] is a 51 y.o. female who presented with painful varicose veins of her left lower extremity.  She had failed conservative treatment and was felt to be a candidate for stab phlebectomies and also sclerotherapy.  Current Outpatient Medications  Medication Sig Dispense Refill   ALPRAZolam (XANAX) 0.25 MG tablet Take up to 2 tablets by mouth every 4 hours prn. 30 tablet 0   amLODipine (NORVASC) 5 MG tablet Take 1 tablet (5 mg total) by mouth in the morning and at bedtime. 180 tablet 3   DULoxetine (CYMBALTA) 30 MG capsule Take 30 mg by mouth daily.     furosemide (LASIX) 10 MG/ML solution Take by mouth as needed.     hydrochlorothiazide (HYDRODIURIL) 25 MG tablet Take 1 tablet (25 mg total) by mouth daily. 90 tablet 3   indomethacin (INDOCIN) 25 MG capsule Take by mouth 2 (two) times daily.     lansoprazole (PREVACID) 15 MG capsule Take 30 mg by mouth in the morning and at bedtime.     LORazepam (ATIVAN) 1 MG tablet Take 1 tablet 30 minutes prior to leaving house on day of office surgery.  Bring second tablet with you to office on day of office surgery. 2 tablet 0   SUMAtriptan (IMITREX) 100 MG tablet Take 100 mg by mouth every 2 (two) hours as needed.     zolpidem (AMBIEN) 10 MG tablet Take 10 mg by mouth at bedtime as needed.     zonisamide (ZONEGRAN) 100 MG capsule Take 1 capsule (100 mg total) by mouth daily. 30 capsule 0   estradiol-norethindrone (ACTIVELLA) 1-0.5 MG tablet Take 1 tablet by mouth at bedtime. (Patient not taking: Reported on 01/21/2021)     meloxicam (MOBIC) 15 MG tablet Take 1 tablet by mouth daily. (Patient not taking: Reported on 01/18/2022)     potassium chloride (KLOR-CON) 10 MEQ tablet Take 1 tablet (10 mEq total) by mouth daily. (Patient not taking: Reported on 01/18/2022) 90 tablet 3   No current  facility-administered medications for this visit.    PHYSICAL EXAM: Vitals:   02/08/22 1042  BP: (!) 144/94  Pulse: (!) 113  Resp: 16  Temp: 99 F (37.2 C)  TempSrc: Temporal  SpO2: 100%  Weight: 175 lb (79.4 kg)  Height: 5\' 3"  (1.6 m)    PROCEDURE: 10-20 stab phlebectomies  TECHNIQUE: The patient was taken to the exam room and the dilated varicose veins were marked with the patient standing.  The patient was then placed supine.  The left leg was prepped and draped in usual sterile fashion.  Tumescent anesthesia was then administered under all the marked areas.  Approximately 15-18 small stab incisions were made over the marked areas.  The veins were grabbed with a hook and brought above the skin and grasped with a hemostat.  They were bluntly excised.  Pressure was held for hemostasis.  Steri-Strips were applied.  A pressure dressing was applied.  The patient tolerated the procedure well.  [facility] [phone]

## 2022-02-08 NOTE — Progress Notes (Signed)
    Stab Phlebectomy Procedure  [name] DOB:06-Feb-1971  02/08/2022  Consent signed: Yes  Surgeon:C. Edilia Bo, MD  Procedure: stab phlebectomy: left leg  BP (!) 144/94 (BP Location: Left Arm, Patient Position: Sitting, Cuff Size: Large)   Pulse (!) 113   Temp 99 F (37.2 C) (Temporal)   Resp 16   Ht 5\' 3"  (1.6 m)   Wt 175 lb (79.4 kg)   SpO2 100%   BMI 31.00 kg/m   Start time: 11:00 AM   End time: 12:00 PM    Tumescent Anesthesia: 300 cc 0.9% NaCl with 50 cc Lidocaine HCL with 1% Epi and 15 cc 8.4% NaHCO3  Local Anesthesia: 4 cc Lidocaine HCL and NaHCO3 (ratio 2:1)    Stab Phlebectomy: 10-20 Sites: Thigh and Calf  Patient tolerated procedure well: Yes  Notes: All staff members wore facial masks.  [name] took Ativan 1 mg on 02-08-2022 at 9:40 AM and at 10:30 AM.   Description of Procedure:  After marking the course of the secondary varicosities, the patient was placed on the operating table in the supine position, and the left leg was prepped and draped in sterile fashion.    The patient was then put into Trendelenburg position.  Local anesthetic was administered at the previously marked varicosities, and tumescent anesthesia was administered around the vessels.  Ten to 20 stab wounds were made using the tip of an 11 blade. And using the vein hook, the phlebectomies were performed using a hemostat to avulse the varicosities.  Adequate hemostasis was achieved, and steri strips were applied to the stab wound.     ABD pads and thigh high compression stockings were applied as well ace wraps where needed. Blood loss was less than 15 cc.  The patient ambulated out of the operating room having tolerated the procedure well.

## 2022-02-16 ENCOUNTER — Telehealth: Payer: Self-pay | Admitting: Cardiology

## 2022-02-16 MED ORDER — AMLODIPINE BESYLATE 5 MG PO TABS: 5.0000 mg | ORAL_TABLET | Freq: Every day | ORAL | 3 refills | Status: DC

## 2022-02-16 MED ORDER — CARVEDILOL 3.125 MG PO TABS: 3.1250 mg | ORAL_TABLET | Freq: Two times a day (BID) | ORAL | 3 refills | Status: DC

## 2022-02-16 NOTE — Telephone Encounter (Signed)
Left message to return call 

## 2022-02-16 NOTE — Telephone Encounter (Signed)
Pt c/o swelling: STAT is pt has developed SOB within 24 hours  If swelling, where is the swelling located? Feet and legs  How much weight have you gained and in what time span? no  Have you gained 3 pounds in a day or 5 pounds in a week? no  Do you have a log of your daily weights (if so, list)? no  Are you currently taking a fluid pill? Yes, took an extra one  Are you currently SOB? No   Have you traveled recently? No   Pt states that her medication was switched to amlodipine because she had allergic reaction to the previous medication. She states she is now having swelling after taking the amlodipine and would like to try a beta blocker.

## 2022-02-16 NOTE — Telephone Encounter (Signed)
I spoke with patient.She will decrease amlodipine to 5 mg qd and begin Coreg 3.125 mg bid. She will watch her BP and HR

## 2022-02-16 NOTE — Telephone Encounter (Signed)
    Swelling is more common with the higher dose of Amlodipine 10 mg daily. She can reduce this to 5 mg daily and would start Coreg 3.125 mg twice daily. We can titrate this as HR/BP allow but she needs to follow both at home as beta-blockers can lower her heart rate. Would continue on Amlodipine for now but at the lower dose of 5mg  daily.  Signed, , PA-C 02/16/2022, 2:59 PM Pager: [phone]

## 2022-02-16 NOTE — Addendum Note (Signed)
Addended by: Marlyn Corporal A on: 02/16/2022 04:16 PM   Modules accepted: Orders

## 2022-03-21 DIAGNOSIS — M25551 Pain in right hip: Secondary | ICD-10-CM | POA: Diagnosis not present

## 2022-03-21 DIAGNOSIS — M797 Fibromyalgia: Secondary | ICD-10-CM | POA: Diagnosis not present

## 2022-03-21 DIAGNOSIS — M7989 Other specified soft tissue disorders: Secondary | ICD-10-CM | POA: Diagnosis not present

## 2022-03-21 DIAGNOSIS — M064 Inflammatory polyarthropathy: Secondary | ICD-10-CM | POA: Diagnosis not present

## 2022-03-21 DIAGNOSIS — M545 Low back pain, unspecified: Secondary | ICD-10-CM | POA: Diagnosis not present

## 2022-03-22 DIAGNOSIS — M25552 Pain in left hip: Secondary | ICD-10-CM | POA: Diagnosis not present

## 2022-03-22 DIAGNOSIS — M25551 Pain in right hip: Secondary | ICD-10-CM | POA: Diagnosis not present

## 2022-04-03 DIAGNOSIS — S42002A Fracture of unspecified part of left clavicle, initial encounter for closed fracture: Secondary | ICD-10-CM | POA: Diagnosis not present

## 2022-04-03 DIAGNOSIS — S52531A Colles' fracture of right radius, initial encounter for closed fracture: Secondary | ICD-10-CM | POA: Diagnosis not present

## 2022-04-05 DIAGNOSIS — S52501A Unspecified fracture of the lower end of right radius, initial encounter for closed fracture: Secondary | ICD-10-CM | POA: Diagnosis not present

## 2022-04-05 DIAGNOSIS — G8918 Other acute postprocedural pain: Secondary | ICD-10-CM | POA: Diagnosis not present

## 2022-04-12 DIAGNOSIS — G8918 Other acute postprocedural pain: Secondary | ICD-10-CM | POA: Diagnosis not present

## 2022-04-12 DIAGNOSIS — S52501A Unspecified fracture of the lower end of right radius, initial encounter for closed fracture: Secondary | ICD-10-CM | POA: Diagnosis not present

## 2022-04-12 DIAGNOSIS — M25531 Pain in right wrist: Secondary | ICD-10-CM | POA: Diagnosis not present

## 2022-04-14 DIAGNOSIS — M25551 Pain in right hip: Secondary | ICD-10-CM | POA: Diagnosis not present

## 2022-04-14 DIAGNOSIS — M545 Low back pain, unspecified: Secondary | ICD-10-CM | POA: Diagnosis not present

## 2022-04-14 DIAGNOSIS — M797 Fibromyalgia: Secondary | ICD-10-CM | POA: Diagnosis not present

## 2022-04-14 DIAGNOSIS — M25552 Pain in left hip: Secondary | ICD-10-CM | POA: Diagnosis not present

## 2022-04-19 DIAGNOSIS — S42002A Fracture of unspecified part of left clavicle, initial encounter for closed fracture: Secondary | ICD-10-CM | POA: Diagnosis not present

## 2022-04-19 DIAGNOSIS — S52501A Unspecified fracture of the lower end of right radius, initial encounter for closed fracture: Secondary | ICD-10-CM | POA: Diagnosis not present

## 2022-05-09 DIAGNOSIS — S42002A Fracture of unspecified part of left clavicle, initial encounter for closed fracture: Secondary | ICD-10-CM | POA: Diagnosis not present

## 2022-05-10 DIAGNOSIS — S52531A Colles' fracture of right radius, initial encounter for closed fracture: Secondary | ICD-10-CM | POA: Diagnosis not present

## 2022-05-15 DIAGNOSIS — Z5181 Encounter for therapeutic drug level monitoring: Secondary | ICD-10-CM | POA: Diagnosis not present

## 2022-05-15 DIAGNOSIS — M797 Fibromyalgia: Secondary | ICD-10-CM | POA: Diagnosis not present

## 2022-05-15 DIAGNOSIS — G43909 Migraine, unspecified, not intractable, without status migrainosus: Secondary | ICD-10-CM | POA: Diagnosis not present

## 2022-05-15 DIAGNOSIS — E785 Hyperlipidemia, unspecified: Secondary | ICD-10-CM | POA: Diagnosis not present

## 2022-05-15 DIAGNOSIS — G4700 Insomnia, unspecified: Secondary | ICD-10-CM | POA: Diagnosis not present

## 2022-05-22 ENCOUNTER — Telehealth: Payer: Self-pay | Admitting: Cardiology

## 2022-05-22 NOTE — Telephone Encounter (Signed)
       05/22/22 We need to get BP readings from patient    Nursing Note: 02/16/22  4:15 PM Note I spoke with patient.She will decrease amlodipine to 5 mg qd and begin Coreg 3.125 mg bid. She will watch her BP and HR

## 2022-05-22 NOTE — Telephone Encounter (Signed)
Pt c/o medication issue:  1. Name of Medication:   carvedilol (COREG) 3.125 MG tablet  2. How are you currently taking this medication (dosage and times per day)?   As prescribed  3. Are you having a reaction (difficulty breathing--STAT)?  No  4. What is your medication issue?   Patient stated she would like to increase the dosage of this medication as her BP has been running high.  Patient stated she is not taking amLODipine (NORVASC) 5 MG tablet.  Patient stated she did not have her last 5 BP reading available.  Patient has had a headache which comes and goes.

## 2022-05-23 MED ORDER — CARVEDILOL 6.25 MG PO TABS: 6.2500 mg | ORAL_TABLET | Freq: Two times a day (BID) | ORAL | 3 refills | Status: DC

## 2022-05-23 NOTE — Telephone Encounter (Signed)
    Please have her increase Coreg to 6.25mg  BID. Follow both HR and BP with the medication change. If BP remains elevated and HR allows, we may need to titrate further to 12.5mg  BID but want to make sure her HR tolerates it.   Signed, Erma Heritage, PA-C 05/23/2022, 8:45 AM

## 2022-05-23 NOTE — Telephone Encounter (Signed)
Pt notified of medication changes and the need to monitor BP and Hr.

## 2022-05-23 NOTE — Telephone Encounter (Signed)
I spoke with patient.She stopped amlodipine due to leg swelling. Was told by pcp to reduce HCTZ to 12.5 mg because her sodium and potassium are low-I will request labs from pcp-. She says her systolic is staying in the 170's and yesterday had a BP reading of 178/101

## 2022-06-05 DIAGNOSIS — R519 Headache, unspecified: Secondary | ICD-10-CM | POA: Diagnosis not present

## 2022-06-08 DIAGNOSIS — G5601 Carpal tunnel syndrome, right upper limb: Secondary | ICD-10-CM | POA: Diagnosis not present

## 2022-06-13 ENCOUNTER — Telehealth: Payer: Self-pay | Admitting: Cardiology

## 2022-06-13 MED ORDER — CARVEDILOL 12.5 MG PO TABS: 12.5000 mg | ORAL_TABLET | Freq: Two times a day (BID) | ORAL | 3 refills | Status: DC

## 2022-06-13 NOTE — Telephone Encounter (Signed)
Pt c/o BP issue: STAT if pt c/o blurred vision, one-sided weakness or slurred speech  1. What are your last 5 BP readings?   115/68 118/75 120/81 121/80 120/76  2. Are you having any other symptoms (ex. Dizziness, headache, blurred vision, passed out)?   No  3. What is your BP issue?    Patient stated she had to go to Urgent Care last week because she had a headache which lasted 3 days due to her BP.  Patient stated her BP was running high and she increased her medication but she is now almost out of this medication.  Patient would like to increase BP medication (carvedilol (COREG) 6.25 MG tablet).  Patient would also like to get the extended release tablets.

## 2022-06-13 NOTE — Telephone Encounter (Signed)
Please have her increase Coreg to 6.25mg  BID. Follow both HR and BP with the medication change. If BP remains elevated and HR allows, we may need to titrate further to 12.5mg  BID but want to make sure her HR tolerates it.    Signed, Erma Heritage, PA-C 05/23/2022, 8:45 AM       06/13/22 patient taking coreg 12.5 mg bid, has not monitored HR  HR 67 148/92 just now  She has been taking coreg 12.5 mg bid and has run out of her 6.25 mg dose

## 2022-06-13 NOTE — Telephone Encounter (Signed)
I spoke with patient and she will increase coreg to 12.5 mg bid  E-scribed to [employer] order.  She will monitor BP/HR

## 2022-06-14 DIAGNOSIS — G5601 Carpal tunnel syndrome, right upper limb: Secondary | ICD-10-CM | POA: Diagnosis not present

## 2022-06-23 DIAGNOSIS — N62 Hypertrophy of breast: Secondary | ICD-10-CM | POA: Diagnosis not present

## 2022-07-07 DIAGNOSIS — G5601 Carpal tunnel syndrome, right upper limb: Secondary | ICD-10-CM | POA: Diagnosis not present

## 2022-07-24 DIAGNOSIS — G5601 Carpal tunnel syndrome, right upper limb: Secondary | ICD-10-CM | POA: Diagnosis not present

## 2022-08-01 ENCOUNTER — Encounter: Payer: Self-pay | Admitting: Vascular Surgery

## 2022-08-10 IMAGING — CT CT ABD-PELV W/O CM
2 of 4 series · 13 of 46 positions shown, 15 images · non-contrast
Comparison: No recent relevant priors available at time dictation.

CLINICAL DATA: Six months of back pain.



[Series 2: routine abdomen pelvis without 5.00 br40 s3 axial · axial · non-contrast · 0.61mm/px · z∈[+1214,+1579]mm · 10 of 87 slices shown, 12 images]
[im 7/87  soft-tissue]
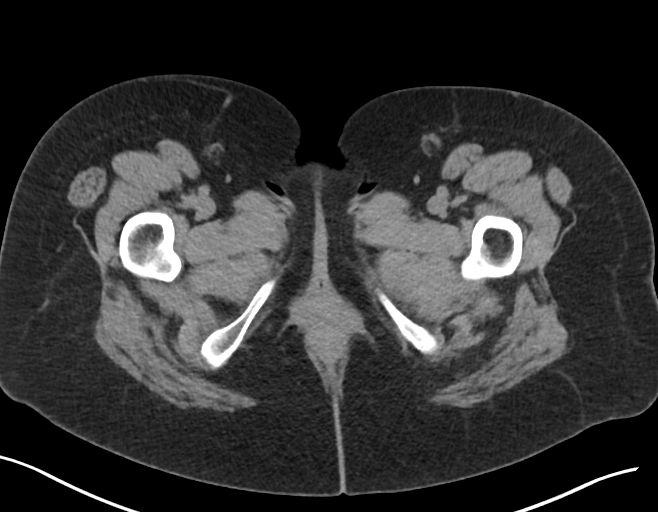
[im 7/87  bone]
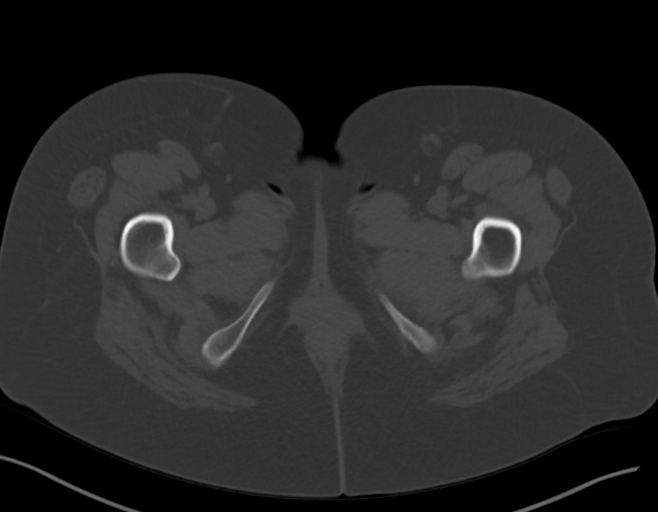
[im 14/87  soft-tissue]
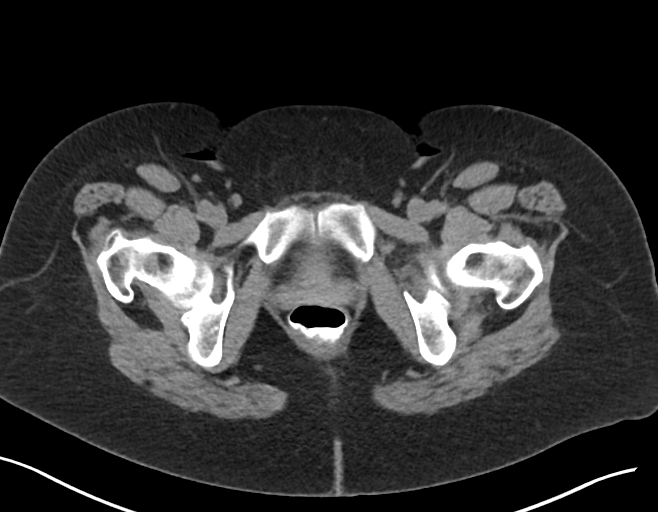
[im 25/87  soft-tissue]
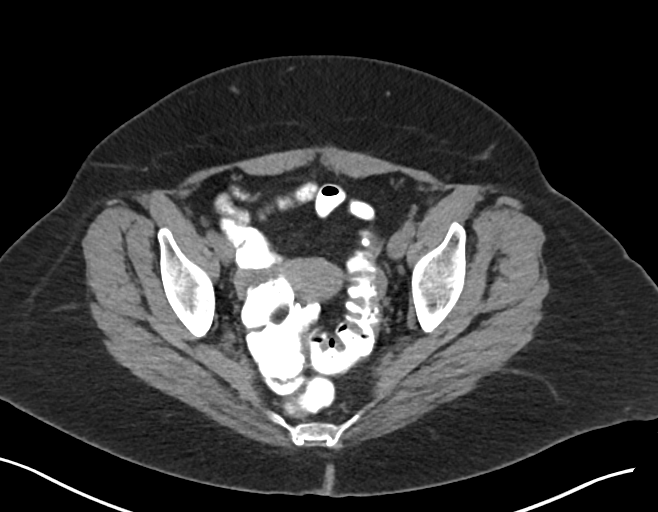
[im 31/87  soft-tissue]
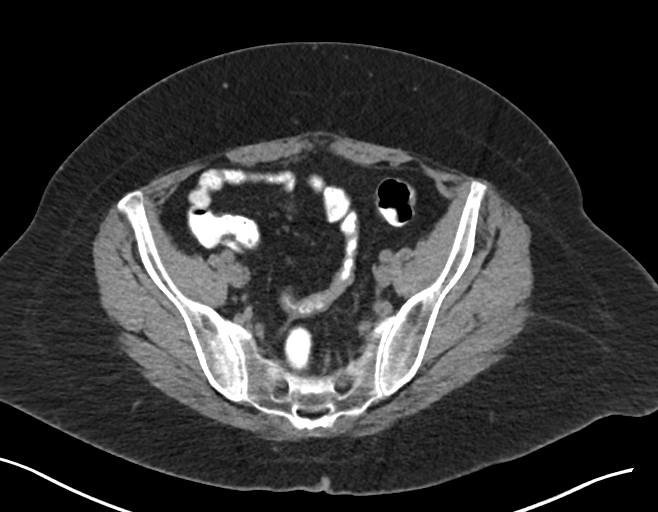
[im 38/87  soft-tissue]
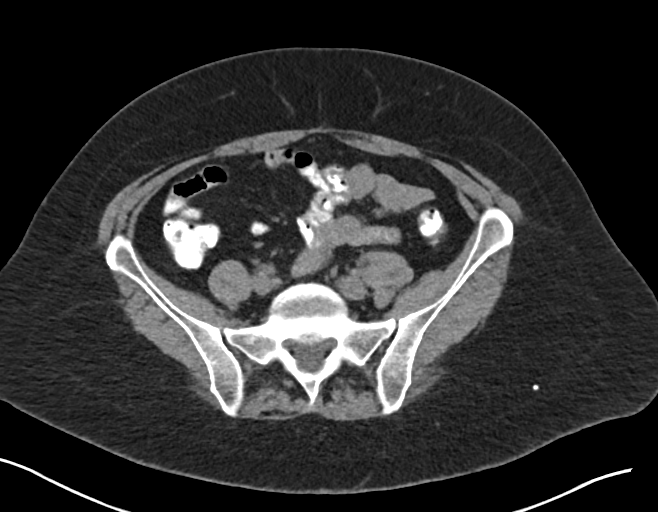
[im 49/87  soft-tissue]
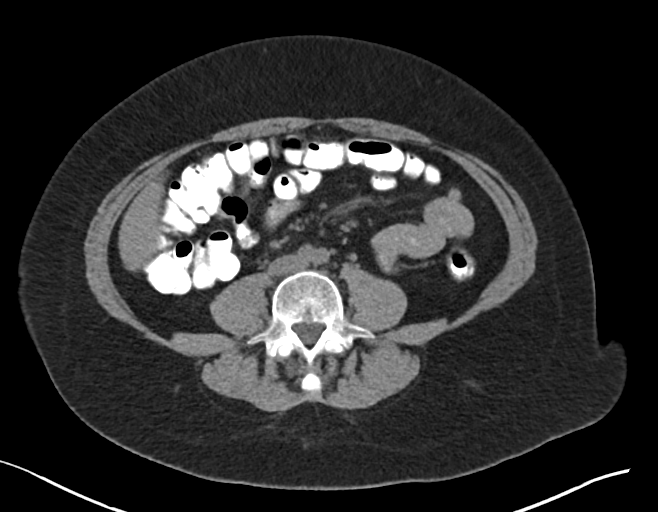
[im 56/87  soft-tissue]
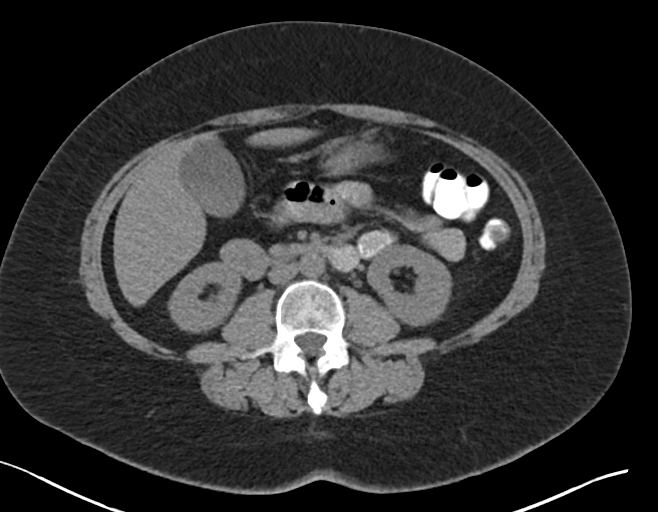
[im 66/87  soft-tissue]
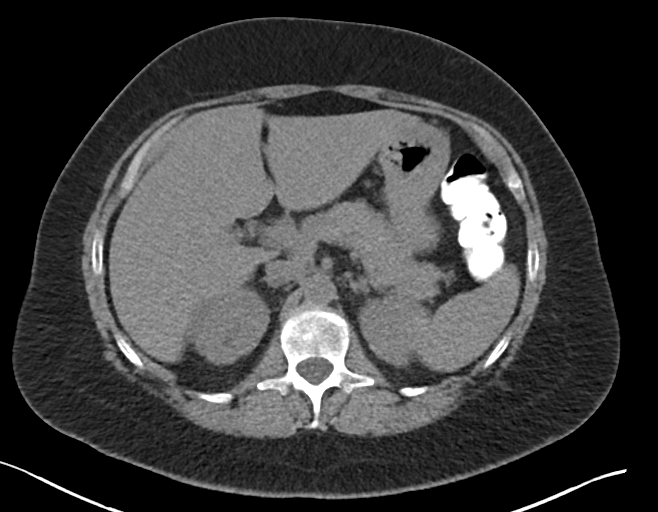
[im 73/87  soft-tissue]
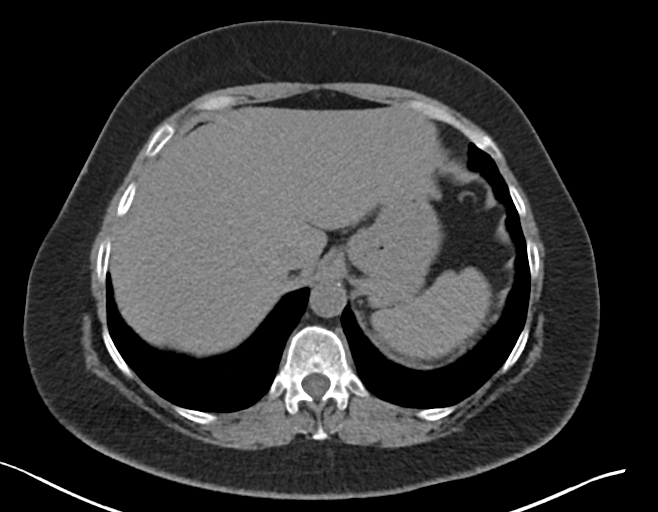
[im 73/87  bone]
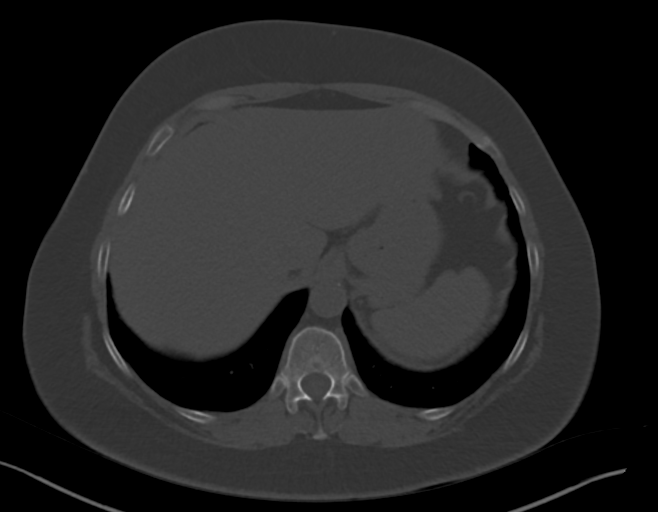
[im 80/87  soft-tissue]
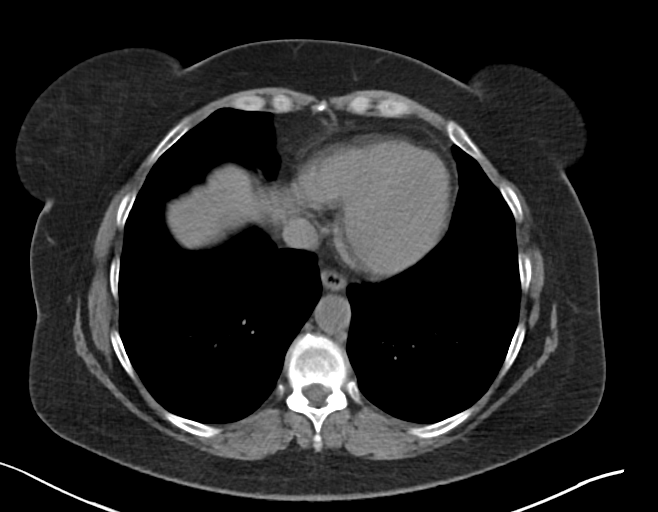

[Series 4: routine abdomen pelvis without 2.00 br40 s3 cor · coronal · non-contrast · 0.78mm/px · 3 of 157 slices shown]
[im 53/157  soft-tissue]
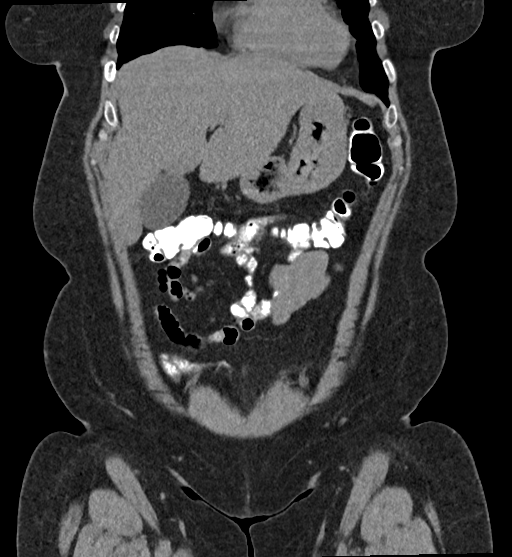
[im 70/157  soft-tissue]
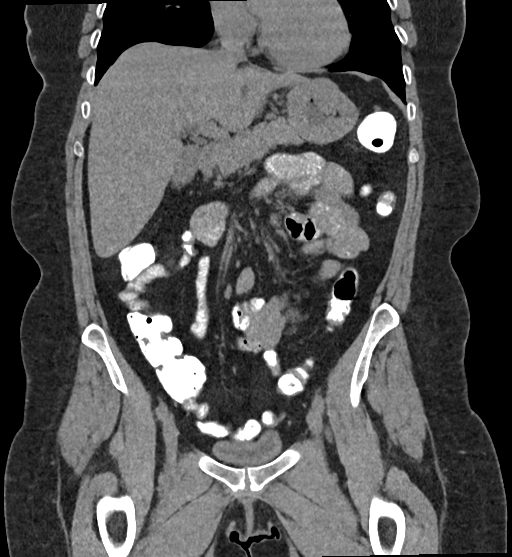
[im 87/157  soft-tissue]
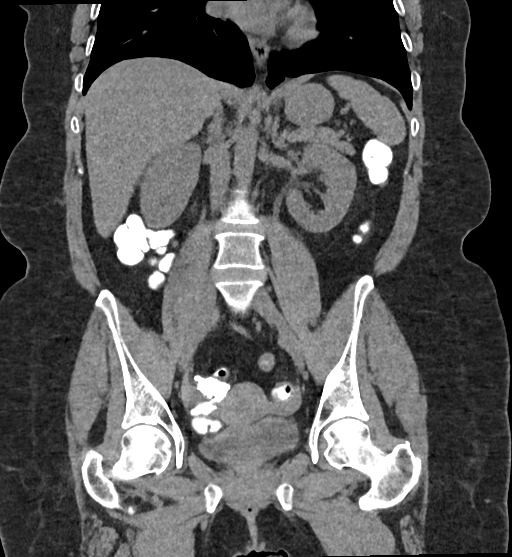

[13 of 46 positions shown; findings below may reference images not displayed]

FINDINGS: Lower chest: No acute abnormality.

Hepatobiliary: Unremarkable noncontrast appearance of the hepatic
parenchyma. Gallbladder is unremarkable. No biliary ductal dilation

Pancreas: . No pancreatic ductal dilation or evidence of acute
inflammation.

Spleen: Within normal limits.

Adrenals/Urinary Tract: Bilateral adrenal glands appear normal. No
hydronephrosis. 16 mm right upper pole renal cyst. No
nephrolithiasis. Urinary bladder is unremarkable for degree of
distension.

Stomach/Bowel: Radiopaque enteric contrast material traverses the
rectum. Stomach is unremarkable for degree of distension. No
pathologic dilation of small or large bowel. Terminal ileum and
appendix appear normal. No evidence of acute bowel inflammation.

Vascular/Lymphatic: Normal caliber abdominal aorta. No
pathologically enlarged abdominal or pelvic lymph nodes.

Reproductive: Uterus and bilateral adnexa are unremarkable.

Other: No significant abdominopelvic free fluid.

Musculoskeletal: Moderate L1 superior endplate compression deformity
which is technically age indeterminate but appears chronic.
IMPRESSION: 1. No acute intra-abdominal or pelvic findings.
2. Age indeterminate L1 superior endplate compression deformity,
recommend correlation with tenderness to palpation to assess acuity.

## 2022-10-25 NOTE — Progress Notes (Deleted)
Cardiology Office Note  Date: 10/25/2022   ID: Brittney, Cook Jan 26, 1971, MRN [id number]  PCP:  Brittney Pepper, MD  Cardiologist:  Brittney Lesches, MD Electrophysiologist:  None   No chief complaint on file.   History of Present Illness: [name] is a 52 y.o. female last seen in December 2022 by Ms. Strader PA-C, I reviewed the note.  Past Medical History:  Diagnosis Date   Acid reflux    Asthma    Essential hypertension    Fibromyalgia    History of cardiomyopathy    Nonischemic 2011, LVEF 45-50%   History of migraine headaches    OSA (obstructive sleep apnea)     Current Outpatient Medications  Medication Sig Dispense Refill   ALPRAZolam (XANAX) 0.25 MG tablet Take up to 2 tablets by mouth every 4 hours prn. 30 tablet 0   amLODipine (NORVASC) 5 MG tablet Take 1 tablet (5 mg total) by mouth daily. 90 tablet 3   carvedilol (COREG) 12.5 MG tablet Take 1 tablet (12.5 mg total) by mouth 2 (two) times daily. 180 tablet 3   DULoxetine (CYMBALTA) 30 MG capsule Take 30 mg by mouth daily.     estradiol-norethindrone (ACTIVELLA) 1-0.5 MG tablet Take 1 tablet by mouth at bedtime. (Patient not taking: Reported on 01/21/2021)     furosemide (LASIX) 10 MG/ML solution Take by mouth as needed.     hydrochlorothiazide (HYDRODIURIL) 25 MG tablet Take 1 tablet (25 mg total) by mouth daily. 90 tablet 3   indomethacin (INDOCIN) 25 MG capsule Take by mouth 2 (two) times daily.     lansoprazole (PREVACID) 15 MG capsule Take 30 mg by mouth in the morning and at bedtime.     LORazepam (ATIVAN) 1 MG tablet Take 1 tablet 30 minutes prior to leaving house on day of office surgery.  Bring second tablet with you to office on day of office surgery. 2 tablet 0   meloxicam (MOBIC) 15 MG tablet Take 1 tablet by mouth daily. (Patient not taking: Reported on 01/18/2022)     potassium chloride (KLOR-CON) 10 MEQ tablet Take 1 tablet (10 mEq total) by mouth daily. (Patient not taking: Reported on  01/18/2022) 90 tablet 3   SUMAtriptan (IMITREX) 100 MG tablet Take 100 mg by mouth every 2 (two) hours as needed.     zolpidem (AMBIEN) 10 MG tablet Take 10 mg by mouth at bedtime as needed.     zonisamide (ZONEGRAN) 100 MG capsule Take 1 capsule (100 mg total) by mouth daily. 30 capsule 0   No current facility-administered medications for this visit.   Allergies:  Valsartan-hydrochlorothiazide, Bupropion, Buspirone hcl, Citalopram hydrobromide, Gabapentin, Iodine-131, Iohexol, and Venlafaxine   ROS:  Please see the history of present illness. Otherwise, complete review of systems is positive for {NONE DEFAULTED:18576}.  All other systems are reviewed and negative.   Physical Exam: VS:  LMP 02/09/2012 , BMI There is no height or weight on file to calculate BMI.  Wt Readings from Last 3 Encounters:  02/08/22 175 lb (79.4 kg)  01/18/22 176 lb (79.8 kg)  10/19/21 178 lb (80.7 kg)    General: Patient appears comfortable at rest. HEENT: Conjunctiva and lids normal, oropharynx clear with moist mucosa. Neck: Supple, no elevated JVP or carotid bruits, no thyromegaly. Lungs: Clear to auscultation, nonlabored breathing at rest. Cardiac: Regular rate and rhythm, no S3 or significant systolic murmur, no pericardial rub. Abdomen: Soft, nontender, no hepatomegaly, bowel sounds present, no guarding or rebound.  Extremities: No pitting edema, distal pulses 2+. Skin: Warm and dry. Musculoskeletal: No kyphosis. Neuropsychiatric: Alert and oriented x3, affect grossly appropriate.  ECG:  An ECG dated 08/18/2021 was personally reviewed today and demonstrated:  Sinus arrhythmia.  Recent Labwork:  September 2023: Hemoglobin 12.5, platelets 432, BUN 16, creatinine 0.92, potassium 3.8, AST 21, ALT 18  Other Studies Reviewed Today:  Echocardiogram 06/28/2020:  1. Left ventricular ejection fraction, by estimation, is 65 to 70%. The  left ventricle has normal function. The left ventricle has no regional   wall motion abnormalities. Left ventricular diastolic parameters are  indeterminate.   2. Right ventricular systolic function is normal. The right ventricular  size is normal.   3. The mitral valve is normal in structure. No evidence of mitral valve  regurgitation. No evidence of mitral stenosis.   4. The aortic valve is tricuspid. Aortic valve regurgitation is not  visualized. No aortic stenosis is present.   5. The inferior vena cava is normal in size with greater than 50%  respiratory variability, suggesting right atrial pressure of 3 mmHg.   Assessment and Plan:   Medication Adjustments/Labs and Tests Ordered: Current medicines are reviewed at length with the patient today.  Concerns regarding medicines are outlined above.   Tests Ordered: No orders of the defined types were placed in this encounter.   Medication Changes: No orders of the defined types were placed in this encounter.   Disposition:  Follow up {follow up:15908}  Signed, Satira Sark, MD, [facility] 10/25/2022 3:16 PM    [facility] at [facility] [address], [place], [place] [address] Phone: [phone]; Fax: [phone]

## 2022-10-26 ENCOUNTER — Ambulatory Visit: Payer: BC Managed Care – PPO | Admitting: Cardiology

## 2022-10-26 DIAGNOSIS — Z8679 Personal history of other diseases of the circulatory system: Secondary | ICD-10-CM

## 2022-10-27 ENCOUNTER — Other Ambulatory Visit: Payer: Self-pay | Admitting: *Deleted

## 2022-10-27 MED ORDER — POTASSIUM CHLORIDE ER 10 MEQ PO TBCR: 10.0000 meq | EXTENDED_RELEASE_TABLET | Freq: Every day | ORAL | 1 refills | Status: DC

## 2022-10-27 MED ORDER — HYDROCHLOROTHIAZIDE 25 MG PO TABS: 25.0000 mg | ORAL_TABLET | Freq: Every day | ORAL | 1 refills | Status: DC

## 2022-10-27 MED ORDER — CARVEDILOL 12.5 MG PO TABS: 12.5000 mg | ORAL_TABLET | Freq: Two times a day (BID) | ORAL | 1 refills | Status: AC

## 2022-11-16 ENCOUNTER — Ambulatory Visit: Payer: BC Managed Care – PPO | Admitting: Cardiology

## 2022-12-05 NOTE — Progress Notes (Deleted)
    Cardiology Office Note  Date: 12/05/2022   ID: [name], DOB [birth year], MRN [id number]  History of Present Illness: [name] is a 52 y.o. female last seen in December 2022 by Ms. Strader PA-C, I reviewed the note.  Physical Exam: VS:  LMP 02/09/2012 , BMI There is no height or weight on file to calculate BMI.  Wt Readings from Last 3 Encounters:  02/08/22 175 lb (79.4 kg)  01/18/22 176 lb (79.8 kg)  10/19/21 178 lb (80.7 kg)    General: Patient appears comfortable at rest. HEENT: Conjunctiva and lids normal, oropharynx clear with moist mucosa. Neck: Supple, no elevated JVP or carotid bruits, no thyromegaly. Lungs: Clear to auscultation, nonlabored breathing at rest. Cardiac: Regular rate and rhythm, no S3 or significant systolic murmur, no pericardial rub. Abdomen: Soft, nontender, no hepatomegaly, bowel sounds present, no guarding or rebound. Extremities: No pitting edema, distal pulses 2+. Skin: Warm and dry. Musculoskeletal: No kyphosis. Neuropsychiatric: Alert and oriented x3, affect grossly appropriate.  ECG:  An ECG dated 08/18/2021 was personally reviewed today and demonstrated:  Sinus arrhythmia.  Labwork:  September 2023: Hemoglobin 12.5, platelets 432, BUN 16, creatinine 0.92, potassium 3.8, AST 21, ALT 18  Other Studies Reviewed Today:  No interval cardiac testing for review today.  Assessment and Plan:  1.  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  2.  Essential hypertension.  Disposition:  Follow up {follow up:15908}  Signed, Satira Sark, M.D., F.A.C.C. [place] at [facility]

## 2022-12-06 ENCOUNTER — Ambulatory Visit: Payer: Self-pay | Attending: Cardiology | Admitting: Cardiology

## 2022-12-06 DIAGNOSIS — Z8679 Personal history of other diseases of the circulatory system: Secondary | ICD-10-CM

## 2022-12-07 ENCOUNTER — Encounter: Payer: Self-pay | Admitting: Cardiology

## 2022-12-09 ENCOUNTER — Other Ambulatory Visit: Payer: Self-pay | Admitting: Cardiology

## 2022-12-17 ENCOUNTER — Other Ambulatory Visit: Payer: Self-pay | Admitting: Cardiology

## 2023-02-06 ENCOUNTER — Other Ambulatory Visit: Payer: Self-pay

## 2023-02-06 ENCOUNTER — Other Ambulatory Visit (HOSPITAL_BASED_OUTPATIENT_CLINIC_OR_DEPARTMENT_OTHER): Payer: Self-pay

## 2023-02-06 ENCOUNTER — Emergency Department (HOSPITAL_BASED_OUTPATIENT_CLINIC_OR_DEPARTMENT_OTHER)
Admission: EM | Admit: 2023-02-06 | Discharge: 2023-02-06 | Disposition: A | Discharge status: Home / Self Care | Payer: BC Managed Care – PPO | Attending: Emergency Medicine | Admitting: Emergency Medicine

## 2023-02-06 DIAGNOSIS — E871 Hypo-osmolality and hyponatremia: Secondary | ICD-10-CM | POA: Diagnosis not present

## 2023-02-06 DIAGNOSIS — M5441 Lumbago with sciatica, right side: Secondary | ICD-10-CM | POA: Diagnosis not present

## 2023-02-06 DIAGNOSIS — M25551 Pain in right hip: Secondary | ICD-10-CM | POA: Diagnosis present

## 2023-02-06 DIAGNOSIS — E876 Hypokalemia: Secondary | ICD-10-CM | POA: Diagnosis not present

## 2023-02-06 MED ORDER — OXYCODONE-ACETAMINOPHEN 5-325 MG PO TABS
1.0000 | ORAL_TABLET | Freq: Once | ORAL | Status: AC
  Administered 2023-02-06: 1 via ORAL
  Filled 2023-02-06: qty 1

## 2023-02-06 MED ORDER — KETOROLAC TROMETHAMINE 15 MG/ML IJ SOLN
15.0000 mg | Freq: Once | INTRAMUSCULAR | Status: DC
  Filled 2023-02-06: qty 1

## 2023-02-06 MED ORDER — KETOROLAC TROMETHAMINE 10 MG PO TABS
10.0000 mg | ORAL_TABLET | Freq: Once | ORAL | Status: AC
  Administered 2023-02-06: 10 mg via ORAL
  Filled 2023-02-06: qty 1

## 2023-02-06 MED ORDER — LIDOCAINE 5 % EX PTCH: 1.0000 | MEDICATED_PATCH | CUTANEOUS | 0 refills | Status: AC

## 2023-02-06 MED ORDER — LIDOCAINE 5 % EX PTCH
1.0000 | MEDICATED_PATCH | Freq: Once | CUTANEOUS | Status: DC
  Administered 2023-02-06: 1 via TRANSDERMAL
  Filled 2023-02-06: qty 1

## 2023-02-06 MED ORDER — METHOCARBAMOL 500 MG PO TABS: 500.0000 mg | ORAL_TABLET | Freq: Two times a day (BID) | ORAL | 0 refills | Status: AC

## 2023-02-06 MED ORDER — DEXAMETHASONE SODIUM PHOSPHATE 10 MG/ML IJ SOLN
10.0000 mg | Freq: Once | INTRAMUSCULAR | Status: AC
  Administered 2023-02-06: 10 mg via INTRAMUSCULAR
  Filled 2023-02-06: qty 1

## 2023-02-06 MED ORDER — ONDANSETRON 4 MG PO TBDP
4.0000 mg | ORAL_TABLET | Freq: Once | ORAL | Status: AC
  Administered 2023-02-06: 4 mg via ORAL
  Filled 2023-02-06: qty 1

## 2023-02-06 MED ORDER — OXYCODONE-ACETAMINOPHEN 5-325 MG PO TABS: 1.0000 | ORAL_TABLET | Freq: Four times a day (QID) | ORAL | 0 refills | Status: DC | PRN

## 2023-02-06 MED ORDER — PREDNISONE 20 MG PO TABS: 40.0000 mg | ORAL_TABLET | Freq: Every day | ORAL | 0 refills | Status: DC

## 2023-02-06 NOTE — Discharge Instructions (Addendum)
It was a pleasure taking care of you today!   You are prescribed Percocet, Lidoderm patch, Robaxin, and prednisone.  Take medications as prescribed.  Ensure to change the Lidoderm patch every 12 hours and replace with a new one.  Do not operate any heavy machinery or drive while taking Percocet or Robaxin as they can make you sleepy/drowsy. You may take over the counter 600 mg ibuprofen every 6 hours as needed for your symptoms, do not take for more than 7 days. You may apply ice or heat to the affected area for up to 15 minutes at a time.  Ensure to place a barrier between your skin and the ice or heat. Call your orthopedist to set up a follow up appointment regarding todays ED visit. Return to the Emergency Department if you are experiencing loss of bowel or bladder, increasing/worsening symptoms, fever, inability to walk.

## 2023-02-06 NOTE — ED Provider Notes (Signed)
[place] EMERGENCY DEPARTMENT AT [facility] Provider Note   CSN: [id number] Arrival date & time: 02/06/23  4098     History  Chief Complaint  Patient presents with   Hip Pain    [name] is a 52 y.o. female who presents emergency department with concerns for right hip pain x 3 days.  Notes that her hip pain radiates down her right leg.  Denies recent fall, injury, trauma, heavy lifting, twisting mechanism, sleeping wrong on the bed.  Has associated decreased sensation to right foot x 3 days.  Has tried ibuprofen at home with her last dose being at 7 AM prior to arrival.  Denies past medical history of sciatica.  Denies bowel/bladder incontinence, urinary symptoms, fever, saddle paresthesia.  The history is provided by the patient. No language interpreter was used.       Home Medications Prior to Admission medications   Medication Sig Start Date End Date Taking? Authorizing Provider  lidocaine (LIDODERM) 5 % Place 1 patch onto the skin daily. Remove & Discard patch within 12 hours or as directed by MD 02/06/23  Yes Ra Pfiester A, PA-C  methocarbamol (ROBAXIN) 500 MG tablet Take 1 tablet (500 mg total) by mouth 2 (two) times daily. 02/06/23  Yes Jamy Cleckler A, PA-C  oxyCODONE-acetaminophen (PERCOCET/ROXICET) 5-325 MG tablet Take 1 tablet by mouth every 6 (six) hours as needed for severe pain. 02/06/23  Yes Campbell Agramonte A, PA-C  predniSONE (DELTASONE) 20 MG tablet Take 2 tablets (40 mg total) by mouth daily for 5 days. 02/06/23 02/11/23 Yes Maya Arcand A, PA-C  progesterone (PROMETRIUM) 100 MG capsule Take 100 mg by mouth daily. 01/08/23  Yes [provider]  ALPRAZolam Prudy Feeler) 0.25 MG tablet Take up to 2 tablets by mouth every 4 hours prn. 10/26/14   Hommel, Sean, DO  amLODipine (NORVASC) 5 MG tablet Take 1 tablet (5 mg total) by mouth daily. 02/16/22 02/11/23  Strader, Lennart Pall, PA-C  carvedilol (COREG) 12.5 MG tablet Take 1 tablet (12.5 mg total) by mouth 2 (two)  times daily. 10/27/22 10/27/23  Jonelle Sidle, MD  DULoxetine (CYMBALTA) 30 MG capsule Take 30 mg by mouth daily. 04/13/20   [provider]  estradiol (VIVELLE-DOT) 0.05 MG/24HR patch Place 1 patch onto the skin 2 (two) times a week.    [provider]  furosemide (LASIX) 10 MG/ML solution Take by mouth as needed.    [provider]  hydrochlorothiazide (HYDRODIURIL) 25 MG tablet Take 1 tablet by mouth daily. 12/11/22   Jonelle Sidle, MD  indomethacin (INDOCIN) 25 MG capsule Take by mouth 2 (two) times daily. 12/03/21   [provider]  lansoprazole (PREVACID) 15 MG capsule Take 30 mg by mouth in the morning and at bedtime. 04/13/20   [provider]  LORazepam (ATIVAN) 1 MG tablet Take 1 tablet 30 minutes prior to leaving house on day of office surgery.  Bring second tablet with you to office on day of office surgery. 02/02/22   Chuck Hint, MD  potassium chloride (KLOR-CON) 10 MEQ tablet Take 1 tablet (10 mEq total) by mouth daily. 10/27/22   Jonelle Sidle, MD  SUMAtriptan (IMITREX) 100 MG tablet Take 100 mg by mouth every 2 (two) hours as needed.    [provider]  zolpidem (AMBIEN) 10 MG tablet Take 10 mg by mouth at bedtime as needed.    [provider]  zonisamide (ZONEGRAN) 100 MG capsule Take 1 capsule (100 mg  total) by mouth daily. 10/26/14   Laren Boom, DO      Allergies    Valsartan-hydrochlorothiazide, Bupropion, Buspirone hcl, Citalopram hydrobromide, Gabapentin, Iodine-131, Iohexol, and Venlafaxine    Review of Systems   Review of Systems  All other systems reviewed and are negative.   Physical Exam Updated Vital Signs BP 131/72   Pulse 77   Temp 98.3 F (36.8 C)   Resp 20   Ht 5\' 3"  (1.6 m)   Wt 74.8 kg   LMP 02/09/2012   SpO2 98%   BMI 29.23 kg/m  Physical Exam Vitals and nursing note reviewed.  Constitutional:      General: She is not in acute distress.    Appearance: Normal  appearance.  Eyes:     General: No scleral icterus.    Extraocular Movements: Extraocular movements intact.  Cardiovascular:     Rate and Rhythm: Normal rate.  Pulmonary:     Effort: Pulmonary effort is normal. No respiratory distress.  Musculoskeletal:     Cervical back: Neck supple.     Comments: Mild tenderness to palpation to right lumbar spine and musculature of right lumbar spine region.  Mild tenderness to palpation noted to right hip.  Positive straight leg raise on the right.  No obvious deformity, effusion, erythema, or swelling.  Full active range of motion of bilateral lower extremities.  Patient ambulatory without difficulty or assistance with antalgic gait.  Strength intact to bilateral lower extremities.  Decreased sensation noted to anterior aspect of right foot.  Neurovascular intact.  Capillary refill less than 2 seconds.  Skin:    General: Skin is warm and dry.     Findings: No bruising, erythema or rash.  Neurological:     Mental Status: She is alert.  Psychiatric:        Behavior: Behavior normal.     ED Results / Procedures / Treatments   Labs (all labs ordered are listed, but only abnormal results are displayed) Labs Reviewed - No data to display  EKG None  Radiology No results found.  Procedures Procedures    Medications Ordered in ED Medications  lidocaine (LIDODERM) 5 % 1 patch (1 patch Transdermal Patch Applied 02/06/23 1041)  dexamethasone (DECADRON) injection 10 mg (10 mg Intramuscular Given 02/06/23 1041)  oxyCODONE-acetaminophen (PERCOCET/ROXICET) 5-325 MG per tablet 1 tablet (1 tablet Oral Given 02/06/23 1041)  ondansetron (ZOFRAN-ODT) disintegrating tablet 4 mg (4 mg Oral Given 02/06/23 1118)  oxyCODONE-acetaminophen (PERCOCET/ROXICET) 5-325 MG per tablet 1 tablet (1 tablet Oral Given 02/06/23 1137)  ketorolac (TORADOL) tablet 10 mg (10 mg Oral Given 02/06/23 1145)    ED Course/ Medical Decision Making/ A&P Clinical Course as of 02/06/23 1208  Tue  Feb 06, 2023  1000 Discussed with patient that we could obtain a CT lumbar spine due to patient's tenderness to palpation to lumbar spine, patient declines at this time noting that her insurance does not start yet.  She notes that she wants to be treated with medications first. [SB]  1055 Pt re-evaluated and noted some improvement of her symptoms with treatment regimen in the ED. discussed with patient that we will give her an additional dose of pain medication here in the ED.  Discussed with patient discharge treatment plan.  Answered all verbal questions.  Patient appears safe for discharge at this time. [SB]    Clinical Course User Index [SB] Ladarian Bonczek A, PA-C  Medical Decision Making Risk Prescription drug management.   Patient with right lumbar/hip pain onset 3 days. No injury, trauma, fall. Vital signs pt afebrile. On exam, patient with Mild tenderness to palpation to right lumbar spine and musculature of right lumbar spine region.  Mild tenderness to palpation noted to right hip.  Positive straight leg raise on the right.  No obvious deformity, effusion, erythema, or swelling.  Full active range of motion of bilateral lower extremities.  Patient ambulatory without difficulty or assistance with antalgic gait.  Strength intact to bilateral lower extremities.  Decreased sensation noted to anterior aspect of right foot.  Neurovascular intact.  Capillary refill less than 2 seconds.Differential diagnosis includes strain/spasm, fracture, dislocation, herniation, sciatica.     Medications:  I ordered medication including toradol, Percocet, Lidoderm patch, warm compress, Decadron for symptom management Reevaluation of the patient after these medicines and interventions, I reevaluated the patient and found that they have improved I have reviewed the patients home medicines and have made adjustments as needed   Disposition: Presenting suspicious for sciatica on right  side.  Doubt concerns at this time for fracture, dislocation, herniation.  Discussed with patient that we could obtain a CT scan due to the location of her symptoms, patient declines at this time due to not having insurance at this time.  Patient opts for treatment with medications only.  Discussed with patient that obtaining a CT can rule out other underlying findings for cause of patient's right lower back pain radiating to the right leg.  At this time patient still would like to opt for treatment with medications at this time.  Patient does have an orthopedist with [facility] that she will follow-up with. After consideration of the diagnostic results and the patients response to treatment, I feel that the patient would benefit from Discharge home.  PDMP reviewed, patient sent with a short course of Percocet for breakthrough pain.  Patient sent a prescription for Robaxin, Lidoderm patch, prednisone.  Patient instructed to follow-up with her orthopedist at [facility] and primary care provider.  Instructed patient to not drive or operate heavy machinery while taking the Percocet or Robaxin. Supportive care measures and strict return precautions discussed with patient at bedside. Pt acknowledges and verbalizes understanding. Pt appears safe for discharge. Follow up as indicated in discharge paperwork.    This chart was dictated using voice recognition software, Dragon. Despite the best efforts of this provider to proofread and correct errors, errors may still occur which can change documentation meaning.   Final Clinical Impression(s) / ED Diagnoses Final diagnoses:  Acute right-sided low back pain with right-sided sciatica    Rx / DC Orders ED Discharge Orders          Ordered    predniSONE (DELTASONE) 20 MG tablet  Daily        02/06/23 1131    lidocaine (LIDODERM) 5 %  Every 24 hours        02/06/23 1131    methocarbamol (ROBAXIN) 500 MG tablet  2 times daily        02/06/23 1131     oxyCODONE-acetaminophen (PERCOCET/ROXICET) 5-325 MG tablet  Every 6 hours PRN        02/06/23 1148              Eura Radabaugh A, PA-C 02/06/23 1208    Elayne Snare K, DO 02/06/23 1553

## 2023-02-06 NOTE — ED Triage Notes (Signed)
Pt arrives pov, steady gait with c/o RT hip pain radiating down leg x 3 days. Denies injury. Reports RT foot numbness. Ibuprofen 400mg  at 0700

## 2023-02-09 ENCOUNTER — Other Ambulatory Visit: Payer: Self-pay

## 2023-02-09 ENCOUNTER — Emergency Department (HOSPITAL_COMMUNITY): Payer: BC Managed Care – PPO

## 2023-02-09 ENCOUNTER — Inpatient Hospital Stay (HOSPITAL_COMMUNITY)
Admission: EM | Admit: 2023-02-09 | Discharge: 2023-02-12 | DRG: 641 | Disposition: A | Discharge status: Home / Self Care | Payer: BC Managed Care – PPO | Attending: Family Medicine | Admitting: Family Medicine

## 2023-02-09 DIAGNOSIS — I5032 Chronic diastolic (congestive) heart failure: Secondary | ICD-10-CM | POA: Diagnosis present

## 2023-02-09 DIAGNOSIS — Z79899 Other long term (current) drug therapy: Secondary | ICD-10-CM | POA: Diagnosis not present

## 2023-02-09 DIAGNOSIS — M5440 Lumbago with sciatica, unspecified side: Secondary | ICD-10-CM | POA: Diagnosis present

## 2023-02-09 DIAGNOSIS — E871 Hypo-osmolality and hyponatremia: Principal | ICD-10-CM | POA: Diagnosis present

## 2023-02-09 DIAGNOSIS — Z91041 Radiographic dye allergy status: Secondary | ICD-10-CM | POA: Diagnosis not present

## 2023-02-09 DIAGNOSIS — J45909 Unspecified asthma, uncomplicated: Secondary | ICD-10-CM | POA: Diagnosis present

## 2023-02-09 DIAGNOSIS — E876 Hypokalemia: Secondary | ICD-10-CM

## 2023-02-09 DIAGNOSIS — E861 Hypovolemia: Secondary | ICD-10-CM | POA: Diagnosis present

## 2023-02-09 DIAGNOSIS — I428 Other cardiomyopathies: Secondary | ICD-10-CM | POA: Diagnosis present

## 2023-02-09 DIAGNOSIS — I8390 Asymptomatic varicose veins of unspecified lower extremity: Secondary | ICD-10-CM | POA: Diagnosis present

## 2023-02-09 DIAGNOSIS — G43909 Migraine, unspecified, not intractable, without status migrainosus: Secondary | ICD-10-CM | POA: Diagnosis present

## 2023-02-09 DIAGNOSIS — Z833 Family history of diabetes mellitus: Secondary | ICD-10-CM

## 2023-02-09 DIAGNOSIS — I7300 Raynaud's syndrome without gangrene: Secondary | ICD-10-CM | POA: Diagnosis present

## 2023-02-09 DIAGNOSIS — M21371 Foot drop, right foot: Secondary | ICD-10-CM | POA: Diagnosis present

## 2023-02-09 DIAGNOSIS — G8929 Other chronic pain: Secondary | ICD-10-CM | POA: Diagnosis present

## 2023-02-09 DIAGNOSIS — M7061 Trochanteric bursitis, right hip: Secondary | ICD-10-CM | POA: Diagnosis present

## 2023-02-09 DIAGNOSIS — Z801 Family history of malignant neoplasm of trachea, bronchus and lung: Secondary | ICD-10-CM

## 2023-02-09 DIAGNOSIS — M797 Fibromyalgia: Secondary | ICD-10-CM | POA: Diagnosis present

## 2023-02-09 DIAGNOSIS — K219 Gastro-esophageal reflux disease without esophagitis: Secondary | ICD-10-CM | POA: Diagnosis present

## 2023-02-09 DIAGNOSIS — I110 Hypertensive heart disease with heart failure: Secondary | ICD-10-CM | POA: Diagnosis present

## 2023-02-09 DIAGNOSIS — M48061 Spinal stenosis, lumbar region without neurogenic claudication: Secondary | ICD-10-CM | POA: Diagnosis present

## 2023-02-09 DIAGNOSIS — G4700 Insomnia, unspecified: Secondary | ICD-10-CM | POA: Diagnosis present

## 2023-02-09 DIAGNOSIS — G4733 Obstructive sleep apnea (adult) (pediatric): Secondary | ICD-10-CM | POA: Diagnosis present

## 2023-02-09 DIAGNOSIS — K589 Irritable bowel syndrome without diarrhea: Secondary | ICD-10-CM | POA: Diagnosis present

## 2023-02-09 DIAGNOSIS — Z888 Allergy status to other drugs, medicaments and biological substances status: Secondary | ICD-10-CM | POA: Diagnosis not present

## 2023-02-09 DIAGNOSIS — Z8249 Family history of ischemic heart disease and other diseases of the circulatory system: Secondary | ICD-10-CM

## 2023-02-09 DIAGNOSIS — M79604 Pain in right leg: Secondary | ICD-10-CM

## 2023-02-09 DIAGNOSIS — Z825 Family history of asthma and other chronic lower respiratory diseases: Secondary | ICD-10-CM

## 2023-02-09 LAB — COMPREHENSIVE METABOLIC PANEL
ALT: 19 U/L (ref 0–44)
ALT: 18 U/L (ref 0–44)
AST: 34 U/L (ref 15–41)
AST: 26 U/L (ref 15–41)
Albumin: 4.1 g/dL (ref 3.5–5.0)
Albumin: 3.8 g/dL (ref 3.5–5.0)
Alkaline Phosphatase: 64 U/L (ref 38–126)
Alkaline Phosphatase: 60 U/L (ref 38–126)
Anion gap: 13 (ref 5–15)
Anion gap: 13 (ref 5–15)
BUN: 12 mg/dL (ref 6–20)
BUN: 12 mg/dL (ref 6–20)
CO2: 28 mmol/L (ref 22–32)
CO2: 27 mmol/L (ref 22–32)
Calcium: 9.3 mg/dL (ref 8.9–10.3)
Calcium: 8.9 mg/dL (ref 8.9–10.3)
Chloride: 75 mmol/L — ABNORMAL LOW (ref 98–111)
Chloride: 76 mmol/L — ABNORMAL LOW (ref 98–111)
Creatinine, Ser: 0.62 mg/dL (ref 0.44–1.00)
Creatinine, Ser: 0.69 mg/dL (ref 0.44–1.00)
GFR, Estimated: >60 mL/min (ref >60)
GFR, Estimated: >60 mL/min (ref >60)
Glucose, Bld: 111 mg/dL — ABNORMAL HIGH (ref 70–99)
Glucose, Bld: 114 mg/dL — ABNORMAL HIGH (ref 70–99)
Potassium: 2.6 mmol/L — CL (ref 3.5–5.1)
Potassium: 2.3 mmol/L — CL (ref 3.5–5.1)
Sodium: 116 mmol/L — CL (ref 135–145)
Sodium: 116 mmol/L — CL (ref 135–145)
Total Bilirubin: 1 mg/dL (ref 0.3–1.2)
Total Bilirubin: 1 mg/dL (ref 0.3–1.2)
Total Protein: 7.5 g/dL (ref 6.5–8.1)
Total Protein: 6.5 g/dL (ref 6.5–8.1)

## 2023-02-09 LAB — CBC WITH DIFFERENTIAL/PLATELET
Abs Immature Granulocytes: 0.08 10*3/uL — ABNORMAL HIGH (ref 0.00–0.07)
Basophils Absolute: 0 10*3/uL (ref 0.0–0.1)
Basophils Relative: 0 %
Eosinophils Absolute: 0.1 10*3/uL (ref 0.0–0.5)
Eosinophils Relative: 1 %
HCT: 32.3 % — ABNORMAL LOW (ref 36.0–46.0)
Hemoglobin: 11.6 g/dL — ABNORMAL LOW (ref 12.0–15.0)
Immature Granulocytes: 1 %
Lymphocytes Relative: 32 %
Lymphs Abs: 1.9 10*3/uL (ref 0.7–4.0)
MCH: 31.6 pg (ref 26.0–34.0)
MCHC: 35.9 g/dL (ref 30.0–36.0)
MCV: 88 fL (ref 80.0–100.0)
Monocytes Absolute: 0.8 10*3/uL (ref 0.1–1.0)
Monocytes Relative: 13 %
Neutro Abs: 3.1 10*3/uL (ref 1.7–7.7)
Neutrophils Relative %: 53 %
Platelets: 286 10*3/uL (ref 150–400)
RBC: 3.67 MIL/uL — ABNORMAL LOW (ref 3.87–5.11)
RDW: 11.9 % (ref 11.5–15.5)
WBC: 6 10*3/uL (ref 4.0–10.5)
nRBC: 0 % (ref 0.0–0.2)

## 2023-02-09 LAB — MAGNESIUM: Magnesium: 1.5 mg/dL — ABNORMAL LOW (ref 1.7–2.4)

## 2023-02-09 LAB — OSMOLALITY, URINE: Osmolality, Ur: 177 mOsm/kg — ABNORMAL LOW (ref 300–900)

## 2023-02-09 LAB — URINALYSIS, ROUTINE W REFLEX MICROSCOPIC
Bilirubin Urine: NEGATIVE
Glucose, UA: NEGATIVE mg/dL
Hgb urine dipstick: NEGATIVE
Ketones, ur: NEGATIVE mg/dL
Leukocytes,Ua: NEGATIVE
Nitrite: NEGATIVE
Protein, ur: NEGATIVE mg/dL
Specific Gravity, Urine: 1.008 (ref 1.005–1.030)
pH: 6 (ref 5.0–8.0)

## 2023-02-09 LAB — SODIUM: Sodium: 117 mmol/L — CL (ref 135–145)

## 2023-02-09 LAB — SODIUM, URINE, RANDOM: Sodium, Ur: <10 mmol/L

## 2023-02-09 LAB — OSMOLALITY: Osmolality: 247 mOsm/kg — CL (ref 275–295)

## 2023-02-09 LAB — POTASSIUM: Potassium: 2.2 mmol/L — CL (ref 3.5–5.1)

## 2023-02-09 MED ORDER — ONDANSETRON HCL 4 MG/2ML IJ SOLN
4.0000 mg | Freq: Once | INTRAMUSCULAR | Status: AC
  Administered 2023-02-09: 4 mg via INTRAVENOUS
  Filled 2023-02-09: qty 2

## 2023-02-09 MED ORDER — ACETAMINOPHEN 500 MG PO TABS
1000.0000 mg | ORAL_TABLET | Freq: Once | ORAL | Status: AC
  Administered 2023-02-09: 1000 mg via ORAL
  Filled 2023-02-09: qty 2

## 2023-02-09 MED ORDER — VITAMIN D 25 MCG (1000 UNIT) PO TABS
1000.0000 [IU] | ORAL_TABLET | Freq: Every day | ORAL | Status: DC
  Administered 2023-02-10 – 2023-02-12 (×3): 1000 [IU] via ORAL
  Filled 2023-02-09 (×3): qty 1

## 2023-02-09 MED ORDER — PREDNISONE 20 MG PO TABS
40.0000 mg | ORAL_TABLET | Freq: Every day | ORAL | Status: DC
  Administered 2023-02-10 – 2023-02-12 (×3): 40 mg via ORAL
  Filled 2023-02-09 (×3): qty 2

## 2023-02-09 MED ORDER — HEPARIN SODIUM (PORCINE) 5000 UNIT/ML IJ SOLN
5000.0000 [IU] | Freq: Three times a day (TID) | INTRAMUSCULAR | Status: DC
  Administered 2023-02-10 – 2023-02-12 (×8): 5000 [IU] via SUBCUTANEOUS
  Filled 2023-02-09 (×8): qty 1

## 2023-02-09 MED ORDER — KETOROLAC TROMETHAMINE 30 MG/ML IJ SOLN
30.0000 mg | Freq: Four times a day (QID) | INTRAMUSCULAR | Status: DC | PRN
  Administered 2023-02-10 (×2): 30 mg via INTRAVENOUS
  Filled 2023-02-09 (×2): qty 1

## 2023-02-09 MED ORDER — HYDROMORPHONE HCL 1 MG/ML IJ SOLN
1.0000 mg | Freq: Once | INTRAMUSCULAR | Status: AC
  Administered 2023-02-09: 1 mg via INTRAVENOUS
  Filled 2023-02-09: qty 1

## 2023-02-09 MED ORDER — ALPRAZOLAM 0.25 MG PO TABS
0.2500 mg | ORAL_TABLET | ORAL | Status: DC | PRN
  Administered 2023-02-10 – 2023-02-11 (×4): 0.5 mg via ORAL
  Administered 2023-02-11: 0.25 mg via ORAL
  Filled 2023-02-09: qty 2
  Filled 2023-02-09: qty 1
  Filled 2023-02-09 (×3): qty 2

## 2023-02-09 MED ORDER — MAGNESIUM SULFATE 2 GM/50ML IV SOLN
2.0000 g | Freq: Once | INTRAVENOUS | Status: AC
  Administered 2023-02-09: 2 g via INTRAVENOUS
  Filled 2023-02-09: qty 50

## 2023-02-09 MED ORDER — DIAZEPAM 5 MG PO TABS
5.0000 mg | ORAL_TABLET | Freq: Once | ORAL | Status: AC
  Administered 2023-02-09: 5 mg via ORAL
  Filled 2023-02-09: qty 1

## 2023-02-09 MED ORDER — OXYCODONE HCL 5 MG PO TABS
5.0000 mg | ORAL_TABLET | Freq: Once | ORAL | Status: AC
  Administered 2023-02-09: 5 mg via ORAL
  Filled 2023-02-09: qty 1

## 2023-02-09 MED ORDER — ONDANSETRON HCL 4 MG/2ML IJ SOLN
4.0000 mg | Freq: Four times a day (QID) | INTRAMUSCULAR | Status: DC | PRN
  Filled 2023-02-09: qty 2

## 2023-02-09 MED ORDER — SODIUM CHLORIDE 0.9 % IV SOLN: INTRAVENOUS | Status: DC

## 2023-02-09 MED ORDER — OXYCODONE HCL 5 MG PO TABS
5.0000 mg | ORAL_TABLET | ORAL | Status: DC | PRN
  Administered 2023-02-10 – 2023-02-12 (×9): 5 mg via ORAL
  Filled 2023-02-09 (×10): qty 1

## 2023-02-09 MED ORDER — PANTOPRAZOLE SODIUM 40 MG PO TBEC
40.0000 mg | DELAYED_RELEASE_TABLET | Freq: Every day | ORAL | Status: DC
  Administered 2023-02-10 – 2023-02-12 (×3): 40 mg via ORAL
  Filled 2023-02-09 (×3): qty 1

## 2023-02-09 MED ORDER — ORAL CARE MOUTH RINSE: 15.0000 mL | OROMUCOSAL | Status: DC | PRN

## 2023-02-09 MED ORDER — ONDANSETRON HCL 4 MG PO TABS
4.0000 mg | ORAL_TABLET | Freq: Four times a day (QID) | ORAL | Status: DC | PRN
  Administered 2023-02-10: 4 mg via ORAL
  Filled 2023-02-09: qty 1

## 2023-02-09 MED ORDER — METHOCARBAMOL 500 MG PO TABS
500.0000 mg | ORAL_TABLET | Freq: Two times a day (BID) | ORAL | Status: DC
  Administered 2023-02-10 – 2023-02-12 (×6): 500 mg via ORAL
  Filled 2023-02-09 (×6): qty 1

## 2023-02-09 MED ORDER — ZONISAMIDE 100 MG PO CAPS
100.0000 mg | ORAL_CAPSULE | Freq: Every day | ORAL | Status: DC
  Administered 2023-02-10 – 2023-02-12 (×3): 100 mg via ORAL
  Filled 2023-02-09 (×3): qty 1

## 2023-02-09 MED ORDER — CARVEDILOL 3.125 MG PO TABS
12.5000 mg | ORAL_TABLET | Freq: Two times a day (BID) | ORAL | Status: DC
  Administered 2023-02-10 – 2023-02-12 (×5): 12.5 mg via ORAL
  Filled 2023-02-09 (×2): qty 1
  Filled 2023-02-09 (×2): qty 4
  Filled 2023-02-09: qty 1
  Filled 2023-02-09: qty 4

## 2023-02-09 MED ORDER — POTASSIUM CHLORIDE 10 MEQ/100ML IV SOLN
10.0000 meq | INTRAVENOUS | Status: AC
  Administered 2023-02-09 – 2023-02-10 (×3): 10 meq via INTRAVENOUS
  Filled 2023-02-09 (×3): qty 100

## 2023-02-09 MED ORDER — KETOROLAC TROMETHAMINE 15 MG/ML IJ SOLN
15.0000 mg | Freq: Once | INTRAMUSCULAR | Status: AC
  Administered 2023-02-09: 15 mg via INTRAMUSCULAR
  Filled 2023-02-09: qty 1

## 2023-02-09 MED ORDER — ALBUTEROL SULFATE (2.5 MG/3ML) 0.083% IN NEBU: 2.5000 mg | INHALATION_SOLUTION | RESPIRATORY_TRACT | Status: DC | PRN

## 2023-02-09 MED ORDER — DULOXETINE HCL 60 MG PO CPEP
60.0000 mg | ORAL_CAPSULE | Freq: Every day | ORAL | Status: DC
  Administered 2023-02-10 – 2023-02-12 (×3): 60 mg via ORAL
  Filled 2023-02-09 (×2): qty 2
  Filled 2023-02-09: qty 1

## 2023-02-09 MED ORDER — POTASSIUM CHLORIDE CRYS ER 10 MEQ PO TBCR: 10.0000 meq | EXTENDED_RELEASE_TABLET | Freq: Every day | ORAL | Status: DC

## 2023-02-09 MED ORDER — METHYLPREDNISOLONE SODIUM SUCC 125 MG IJ SOLR
125.0000 mg | Freq: Once | INTRAMUSCULAR | Status: AC
  Administered 2023-02-10: 125 mg via INTRAVENOUS
  Filled 2023-02-09: qty 2

## 2023-02-09 MED ORDER — CHLORHEXIDINE GLUCONATE CLOTH 2 % EX PADS
6.0000 | MEDICATED_PAD | Freq: Every day | CUTANEOUS | Status: DC
  Administered 2023-02-10 – 2023-02-12 (×3): 6 via TOPICAL

## 2023-02-09 NOTE — ED Provider Notes (Signed)
[place] EMERGENCY DEPARTMENT AT [facility] Provider Note   CSN: [id number] Arrival date & time: 02/09/23  1039     History  Chief Complaint  Patient presents with   Hip Pain   Leg Pain    [name] is a 52 y.o. female.  52 y.o female with a PMH of OSA, Migraines presents to the ED with a chief complaint of back pain that is been ongoing for the past 5 days.  Evaluated in the emergency department 3 days ago, given a prescription for steroids, muscle relaxers without any improvement in symptoms.  She was also seen at urgent care today and sent to the emergency department in order to obtain further imaging.  She does have a establish orthopedics Dr. Darrelyn Hillock who sees her for her back pain and hip pain in the past.  She describes a sharp shooting sensation originating at the right hip radiating down her right leg exacerbated with any type of movement.  No alleviating factors.  No prior history of cancer, no fever, no bowel or bladder.   The history is provided by the patient.  Hip Pain Pertinent negatives include no chest pain, no abdominal pain and no shortness of breath.  Leg Pain Associated symptoms: back pain   Associated symptoms: no fever        Home Medications Prior to Admission medications   Medication Sig Start Date End Date Taking? Authorizing Provider  ALPRAZolam Prudy Feeler) 0.25 MG tablet Take up to 2 tablets by mouth every 4 hours prn. Patient taking differently: Take 0.25-0.5 mg by mouth every 4 (four) hours as needed for anxiety. 10/26/14  Yes Hommel, Sean, DO  carvedilol (COREG) 12.5 MG tablet Take 1 tablet (12.5 mg total) by mouth 2 (two) times daily. 10/27/22 10/27/23 Yes Jonelle Sidle, MD  Cholecalciferol (D 1000) 25 MCG (1000 UT) capsule Take 1,000 Units by mouth daily. 06/05/22  Yes [provider]  doxycycline (DORYX) 100 MG EC tablet Take 100 mg by mouth daily.   Yes [provider]  DULoxetine (CYMBALTA) 60 MG capsule Take  60 mg by mouth daily.   Yes [provider]  estradiol (VIVELLE-DOT) 0.05 MG/24HR patch Place 1 patch onto the skin 2 (two) times a week.   Yes [provider]  furosemide (LASIX) 20 MG tablet Take 20 mg by mouth daily as needed for fluid or edema.   Yes [provider]  indomethacin (INDOCIN) 25 MG capsule Take 50 mg by mouth 2 (two) times daily. 12/03/21  Yes [provider]  lansoprazole (PREVACID) 15 MG capsule Take 30 mg by mouth daily. 04/13/20  Yes [provider]  lidocaine (LIDODERM) 5 % Place 1 patch onto the skin daily. Remove & Discard patch within 12 hours or as directed by MD 02/06/23  Yes Blue, Soijett A, PA-C  methocarbamol (ROBAXIN) 500 MG tablet Take 1 tablet (500 mg total) by mouth 2 (two) times daily. 02/06/23  Yes Blue, Soijett A, PA-C  oxyCODONE-acetaminophen (PERCOCET/ROXICET) 5-325 MG tablet Take 1 tablet by mouth every 6 (six) hours as needed for severe pain. 02/06/23  Yes Blue, Soijett A, PA-C  potassium chloride (KLOR-CON) 10 MEQ tablet Take 1 tablet (10 mEq total) by mouth daily. 10/27/22  Yes Jonelle Sidle, MD  predniSONE (DELTASONE) 20 MG tablet Take 2 tablets (40 mg total) by mouth daily for 5 days. 02/06/23 02/11/23 Yes Blue, Soijett A, PA-C  progesterone (PROMETRIUM) 100 MG capsule Take 100 mg by mouth every  evening. 01/08/23  Yes [provider]  SUMAtriptan (IMITREX) 100 MG tablet Take 100 mg by mouth every 2 (two) hours as needed for migraine or headache.   Yes [provider]  zolpidem (AMBIEN) 10 MG tablet Take 10 mg by mouth at bedtime as needed for sleep.   Yes [provider]  zonisamide (ZONEGRAN) 100 MG capsule Take 1 capsule (100 mg total) by mouth daily. 10/26/14  Yes Hommel, Sean, DO  amLODipine (NORVASC) 5 MG tablet Take 1 tablet (5 mg total) by mouth daily. Patient not taking: Reported on 02/09/2023 02/16/22 02/11/23  [place] Ouch, Lennart Pall, PA-C  hydrochlorothiazide (HYDRODIURIL) 25 MG tablet Take 1  tablet by mouth daily. Patient not taking: Reported on 02/09/2023 12/11/22   Jonelle Sidle, MD  LORazepam (ATIVAN) 1 MG tablet Take 1 tablet 30 minutes prior to leaving house on day of office surgery.  Bring second tablet with you to office on day of office surgery. Patient not taking: Reported on 02/09/2023 02/02/22   Chuck Hint, MD      Allergies    Thiazide-type diuretics, Valsartan-hydrochlorothiazide, Bupropion, Buspirone hcl, Citalopram hydrobromide, Gabapentin, Iodine-131, Iohexol, and Venlafaxine    Review of Systems   Review of Systems  Constitutional:  Negative for chills and fever.  HENT:  Negative for sore throat.   Respiratory:  Negative for shortness of breath.   Cardiovascular:  Negative for chest pain.  Gastrointestinal:  Negative for abdominal pain, nausea and vomiting.  Genitourinary:  Negative for flank pain.  Musculoskeletal:  Positive for back pain and myalgias.  Skin:  Negative for pallor and wound.  Neurological:  Negative for light-headedness.  All other systems reviewed and are negative.   Physical Exam Updated Vital Signs BP (!) 153/72   Pulse 91   Temp 97.6 F (36.4 C)   Resp 19   Ht 5\' 3"  (1.6 m)   Wt 78.4 kg   LMP 02/09/2012   SpO2 100%   BMI 30.62 kg/m  Physical Exam Vitals and nursing note reviewed.  Constitutional:      Appearance: Normal appearance.  HENT:     Head: Normocephalic and atraumatic.     Mouth/Throat:     Mouth: Mucous membranes are moist.  Cardiovascular:     Rate and Rhythm: Normal rate.  Pulmonary:     Effort: Pulmonary effort is normal.  Abdominal:     General: Abdomen is flat.  Musculoskeletal:     Cervical back: Normal range of motion and neck supple.     Lumbar back: Tenderness present. Decreased range of motion.     Comments: TTP along the right lumbar spine, radiating down the right buttocks.  2+ DP, PT pulses.  Decrease in strength with straight leg raise on the right side.  Skin:    General: Skin  is warm and dry.  Neurological:     Mental Status: She is alert and oriented to person, place, and time.     ED Results / Procedures / Treatments   Labs (all labs ordered are listed, but only abnormal results are displayed) Labs Reviewed  COMPREHENSIVE METABOLIC PANEL - Abnormal; Notable for the following components:      Result Value   Sodium 116 (*)    Potassium 2.6 (*)    Chloride 75 (*)    Glucose, Bld 111 (*)    All other components within normal limits  CBC WITH DIFFERENTIAL/PLATELET - Abnormal; Notable for the following components:   RBC 3.67 (*)  Hemoglobin 11.6 (*)    HCT 32.3 (*)    Abs Immature Granulocytes 0.08 (*)    All other components within normal limits  COMPREHENSIVE METABOLIC PANEL - Abnormal; Notable for the following components:   Sodium 116 (*)    Potassium 2.3 (*)    Chloride 76 (*)    Glucose, Bld 114 (*)    All other components within normal limits  OSMOLALITY, URINE - Abnormal; Notable for the following components:   Osmolality, Ur 177 (*)    All other components within normal limits  OSMOLALITY - Abnormal; Notable for the following components:   Osmolality 247 (*)    All other components within normal limits  MAGNESIUM - Abnormal; Notable for the following components:   Magnesium 1.5 (*)    All other components within normal limits  SODIUM - Abnormal; Notable for the following components:   Sodium 117 (*)    All other components within normal limits  POTASSIUM - Abnormal; Notable for the following components:   Potassium 2.2 (*)    All other components within normal limits  CBC - Abnormal; Notable for the following components:   RBC 3.77 (*)    HCT 34.2 (*)    All other components within normal limits  COMPREHENSIVE METABOLIC PANEL - Abnormal; Notable for the following components:   Sodium 123 (*)    Potassium 2.8 (*)    Chloride 84 (*)    Glucose, Bld 123 (*)    All other components within normal limits  SODIUM - Abnormal; Notable  for the following components:   Sodium 129 (*)    All other components within normal limits  SODIUM - Abnormal; Notable for the following components:   Sodium 127 (*)    All other components within normal limits  RENAL FUNCTION PANEL - Abnormal; Notable for the following components:   Sodium 127 (*)    Potassium 3.4 (*)    Chloride 90 (*)    Glucose, Bld 136 (*)    Phosphorus 2.1 (*)    All other components within normal limits  RENAL FUNCTION PANEL - Abnormal; Notable for the following components:   Sodium 131 (*)    Chloride 97 (*)    Glucose, Bld 109 (*)    Albumin 3.2 (*)    All other components within normal limits  BASIC METABOLIC PANEL - Abnormal; Notable for the following components:   Sodium 131 (*)    Chloride 97 (*)    Glucose, Bld 106 (*)    All other components within normal limits  MRSA NEXT GEN BY PCR, NASAL  URINALYSIS, ROUTINE W REFLEX MICROSCOPIC  SODIUM, URINE, RANDOM  HIV ANTIBODY (ROUTINE TESTING W REFLEX)  MAGNESIUM  TSH  CORTISOL  TSH  NA AND K (SODIUM & POTASSIUM), RAND UR  SODIUM, URINE, RANDOM  OSMOLALITY, URINE  CREATININE, URINE, RANDOM  RENAL FUNCTION PANEL    EKG EKG Interpretation  Date/Time:  Friday February 09 2023 17:12:46 EDT Ventricular Rate:  68 PR Interval:  146 QRS Duration: 93 QT Interval:  419 QTC Calculation: 446 R Axis:   90 Text Interpretation: Sinus rhythm Borderline right axis deviation No significant change since last tracing Confirmed by Linwood Dibbles [phone]) on 02/09/2023 5:27:54 PM  Radiology DG Hip Unilat W or Wo Pelvis 2-3 Views Right  Result Date: 02/09/2023 CLINICAL DATA:  Right hip pain shooting down the leg. EXAM: DG HIP (WITH OR WITHOUT PELVIS) 2-3V RIGHT COMPARISON:  None Available. FINDINGS: There is no evidence  of hip fracture or dislocation. There is no evidence of arthropathy or other focal bone abnormality. IMPRESSION: Negative. Electronically Signed   By: Minerva Fester M.D.   On: 02/09/2023 21:36   MR  LUMBAR SPINE WO CONTRAST  Result Date: 02/09/2023 CLINICAL DATA:  Low back pain and right leg pain for 4 days. EXAM: MRI LUMBAR SPINE WITHOUT CONTRAST TECHNIQUE: Multiplanar, multisequence MR imaging of the lumbar spine was performed. No intravenous contrast was administered. COMPARISON:  CT abdomen/pelvis 10/17/2021. FINDINGS: Segmentation: Standard; the lowest formed disc space is designated L5-S1. Alignment:  Normal. Vertebrae: Compression deformity of the L1 vertebral body with up to approximately 30% loss of vertebral body height anteriorly is unchanged since 2023, without marrow edema or bony retropulsion. The other vertebral body heights are preserved. Background marrow signal is normal. There is no suspicious marrow signal abnormality or marrow edema. Conus medullaris and cauda equina: Conus extends to the L1 level. Conus and cauda equina appear normal. Paraspinal and other soft tissues: Unremarkable. Disc levels: The disc heights are overall preserved with mild desiccation at T11-T12 and L3-L4. T12-L1: Mild right worse than left facet arthropathy without significant spinal canal or neural foraminal stenosis. L1-L2: There is a minimal disc bulge and minimal facet arthropathy without significant spinal canal or neural foraminal stenosis. L2-L3: There is a shallow left foraminal/extraforaminal protrusion without significant spinal canal or neural foraminal stenosis. L3-L4: There is a mild disc bulge, mild bilateral facet arthropathy, and prominent dorsal epidural fat resulting in mild spinal canal stenosis and no significant neural foraminal stenosis. L4-L5: There is a mild disc bulge, mild bilateral facet arthropathy, and prominent dorsal epidural fat resulting in mild left subarticular zone narrowing without evidence of nerve root impingement, and mild-to-moderate right and mild left neural foraminal stenosis L5-S1: No significant spinal canal or neural foraminal stenosis. IMPRESSION: 1. 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 No acute osseous finding. 2. At L3-L4 there is mild spinal canal narrowing, and at L4-L5 there is mild left subarticular zone narrowing without evidence of impingement and mild-to-moderate right and mild left neural foraminal stenosis due to mild disc bulges, mild facet arthropathy, and prominent dorsal epidural fat. No high-grade spinal canal or neural foraminal stenosis at any level. Electronically Signed   By: Lesia Hausen M.D.   On: 02/09/2023 19:02    Procedures Procedures    Medications Ordered in ED Medications  carvedilol (COREG) tablet 12.5 mg (12.5 mg Oral Given 02/10/23 2248)  ALPRAZolam (XANAX) tablet 0.25-0.5 mg (0.5 mg Oral Given 02/11/23 0340)  DULoxetine (CYMBALTA) DR capsule 60 mg (60 mg Oral Given 02/10/23 0932)  predniSONE (DELTASONE) tablet 40 mg (40 mg Oral Given 02/10/23 0754)  pantoprazole (PROTONIX) EC tablet 40 mg (40 mg Oral Given 02/10/23 0932)  methocarbamol (ROBAXIN) tablet 500 mg (500 mg Oral Given 02/10/23 2248)  zonisamide (ZONEGRAN) capsule 100 mg (100 mg Oral Given 02/10/23 0933)  cholecalciferol (VITAMIN D3) 25 MCG (1000 UNIT) tablet 1,000 Units (1,000 Units Oral Given 02/10/23 0932)  heparin injection 5,000 Units (5,000 Units Subcutaneous Given 02/11/23 0558)  oxyCODONE (Oxy IR/ROXICODONE) immediate release tablet 5 mg (5 mg Oral Given 02/11/23 0555)  ondansetron (ZOFRAN) tablet 4 mg (4 mg Oral Given 02/10/23 2248)    Or  ondansetron (ZOFRAN) injection 4 mg ( Intravenous See Alternative 02/10/23 2248)  albuterol (PROVENTIL) (2.5 MG/3ML) 0.083% nebulizer solution 2.5 mg (has no administration in time range)  Chlorhexidine Gluconate Cloth 2 % PADS 6 each (6 each Topical Given 02/10/23 2200)  Oral care mouth rinse (has no administration in time range)  0.9 %  sodium chloride infusion ( Intravenous Stopped 02/10/23 1613)  potassium chloride 10 mEq in 100 mL IVPB (10 mEq Intravenous Not Given 02/10/23 1140)  potassium chloride SA (KLOR-CON M)  CR tablet 40 mEq (40 mEq Oral Given 02/10/23 1003)  diazepam (VALIUM) tablet 2 mg (2 mg Oral Given 02/11/23 0555)  pregabalin (LYRICA) capsule 75 mg (has no administration in time range)  SUMAtriptan (IMITREX) tablet 100 mg (has no administration in time range)  lidocaine (LIDODERM) 5 % 1 patch (1 patch Transdermal Patient Refused/Not Given 02/10/23 2253)  indomethacin (INDOCIN SR) capsule 75 mg (has no administration in time range)  0.9 %  sodium chloride infusion ( Intravenous Infusion Verify 02/11/23 0600)  amLODipine (NORVASC) tablet 5 mg (5 mg Oral Given 02/10/23 1643)  hydrALAZINE (APRESOLINE) injection 5 mg (has no administration in time range)  alum & mag hydroxide-simeth (MAALOX/MYLANTA) 200-200-20 MG/5ML suspension 30 mL (30 mLs Oral Given 02/10/23 2053)  oxyCODONE (Oxy IR/ROXICODONE) immediate release tablet 5 mg (5 mg Oral Given 02/09/23 1146)  diazepam (VALIUM) tablet 5 mg (5 mg Oral Given 02/09/23 1147)  acetaminophen (TYLENOL) tablet 1,000 mg (1,000 mg Oral Given 02/09/23 1147)  ketorolac (TORADOL) 15 MG/ML injection 15 mg (15 mg Intramuscular Given 02/09/23 1147)  HYDROmorphone (DILAUDID) injection 1 mg (1 mg Intravenous Given 02/09/23 1400)  ondansetron (ZOFRAN) injection 4 mg (4 mg Intravenous Given 02/09/23 1401)  HYDROmorphone (DILAUDID) injection 1 mg (1 mg Intravenous Given 02/09/23 1658)  potassium chloride 10 mEq in 100 mL IVPB (0 mEq Intravenous Stopped 02/10/23 0242)  magnesium sulfate IVPB 2 g 50 mL (0 g Intravenous Stopping previously hung infusion 02/09/23 2313)  HYDROmorphone (DILAUDID) injection 1 mg (1 mg Intravenous Given 02/09/23 2311)  methylPREDNISolone sodium succinate (SOLU-MEDROL) 125 mg/2 mL injection 125 mg (125 mg Intravenous Given 02/10/23 0020)  acetaminophen (TYLENOL) tablet 1,000 mg (1,000 mg Oral Given 02/10/23 0526)  potassium chloride 10 mEq in 100 mL IVPB (0 mEq Intravenous Stopped 02/10/23 1331)  potassium chloride SA (KLOR-CON M) CR tablet 40 mEq (40 mEq Oral Given 02/10/23 2052)   pantoprazole (PROTONIX) EC tablet 40 mg (40 mg Oral Given 02/10/23 2248)  zolpidem (AMBIEN) tablet 5 mg (5 mg Oral Given 02/11/23 0043)  HYDROmorphone (DILAUDID) injection 0.5 mg (0.5 mg Intravenous Given 02/11/23 0339)    ED Course/ Medical Decision Making/ A&P Clinical Course as of 02/11/23 0651  Fri Feb 09, 2023  1652 Patient reassessed.  Pending MRI.  Requesting additional dose of pain medicine.  She is neurovascularly intact.  No clinical evidence of VTE on exam.  No redness or warmth.  Patient states she has never had a back problem previously she follows with Dr. Bobby Rumpf with [facility] for hip pain however is never been seen for anyone for her back.  We discussed her significantly low sodium and potassium.  She was seen by her primary care provider 1 month ago had basic labs performed was noted to hyponatremia, she states that her dose of her HCTZ was cut in half.  She has not had a recheck of this. [BH]  2017 Went and discussed labs with patient.  She states that her second round of labs were drawn off the IV despite blood nursing stated.  Will straight stick.  She has only received one half round of IV potassium at this point.  I discussed with Katie, paramedic to hold on any IV potassium until repeat straight stick labs have  returned [BH]  2042 Straight stick labs which I witnessed are still persistently low, she will be treated with potassium, magnesium, thankfully no EKG changes. [BH]  2055 Consult with Dr. Jake Samples with neurosurgery.  We discussed her MRI.  Nothing to be done inpatient, pain control and she can follow-up outpatient for reassessment [BH]  2204 Dr. Maisie Fus with hospitalist with see for admission [BH]    Clinical Course User Index [BH] Henderly, Britni A, PA-C                             Medical Decision Making Amount and/or Complexity of Data Reviewed Labs: ordered. Radiology: ordered.  Risk OTC drugs. Prescription drug management. Decision regarding  hospitalization.   This patient presents to the ED for concern of back pain, this involves a number of treatment options, and is a complaint that carries with it a high risk of complications and morbidity.  The differential diagnosis includes sciatica, disc herniation versus epidural abscess.    Co morbidities: Discussed in HPI   Brief History:  See HPI.   EMR reviewed including pt PMHx, past surgical history and past visits to ER.   See HPI for more details   Lab Tests:  I ordered and independently interpreted labs.  The pertinent results include:    First CMP remarkable with hyponatremia, along with hypokalemia  Imaging Studies:  MRI Lumbar Spine  Medicines ordered:  I ordered medication including valium, tylenol, toradol, roxi  for pain control Reevaluation of the patient after these medicines showed that the patient stayed the same I have reviewed the patients home medicines and have made adjustments as needed  Consults:  I requested consultation with Orthopedics on call,  and discussed lab and imaging findings as well as pertinent plan - I was unable to obtain a call back despite two pages to Orthopedics, will defer consult to incoming team.   Reevaluation:  After the interventions noted above I re-evaluated patient and found that they have :stayed the same   Social Determinants of Health:  The patient's social determinants of health were a factor in the care of this patient   Problem List / ED Course:  Patient presents to the ED with low back pain that is been ongoing for the past 3 days, evaluated at [facility] started on steroids, muscle relaxers, narcotics for pain control without any improvement in symptoms.  Evaluated urgent care today and sent to the emergency department for further management of pain.  Established patient of [facility], here with no improvement despite multiple rounds of pain medication, we proceeded with IV narcotic pain at this time,  consult to orthopedic center to obtain further recommendations for pain control. Attempted to call orthopedics twice, however no return of my pages over the past hour and a half.  Patient's blood work came remarkable for hyponatremia with hypokalemia, however concern for diluted specimen, will obtain repeat.  I have also added along CBC.  As patient has been in the ED waiting, given Dilaudid for IV for pain control.   Dispostion:  Pending MRI Lumbar along with blood work for further disposition.    Portions of this note were generated with Scientist, clinical (histocompatibility and immunogenetics). Dictation errors may occur despite best attempts at proofreading.  Final Clinical Impression(s) / ED Diagnoses Final diagnoses:  Right leg pain  Hyponatremia  Hypokalemia  Hypomagnesemia    Rx / DC Orders ED Discharge Orders     None  Claude Manges, PA-C 02/11/23 1610    Margarita Grizzle, MD 02/12/23 1120

## 2023-02-09 NOTE — ED Notes (Signed)
Lab called to add on Urine Sodium level and Osmolality

## 2023-02-09 NOTE — ED Triage Notes (Addendum)
Pt arrived POV. C/o R hip pain, that shoots down leg for 5x days. Pt referred from Ortho w/concern for bulging disc. Pt unable to sit during triage

## 2023-02-09 NOTE — ED Provider Notes (Signed)
Care assumed from previous provider, see note for full HPI.  In summation 52 year old history of chronic hip pain followed by Dr. Darrelyn Hillock with [facility] here for evaluation of right lower back pain with extension into her leg.  Was seen in the ED previously prescribed narcotics, pain management however no relief.  She was seen by [facility] today and there is concern for spinal etiology causing her pain and was sent here for further evaluation.  Patient with significant pain requiring multiple doses of IV narcotics.  Plan follow-up on MRI.  Of note screening labs were performed due to anticipation of admission for pain control noted to have hyponatremia and hypokalemia.  Plan to repeat these Physical Exam  BP (!) 128/108   Pulse 65   Temp 97.6 F (36.4 C) (Oral)   Resp 14   Ht 5\' 3"  (1.6 m)   Wt 74.8 kg   LMP 02/09/2012   SpO2 100%   BMI 29.23 kg/m   Physical Exam Vitals and nursing note reviewed.  Constitutional:      General: She is not in acute distress.    Appearance: She is well-developed. She is not ill-appearing.  HENT:     Head: Atraumatic.  Eyes:     Pupils: Pupils are equal, round, and reactive to light.  Cardiovascular:     Rate and Rhythm: Normal rate.     Pulses: Normal pulses.          Dorsalis pedis pulses are 2+ on the right side and 2+ on the left side.     Heart sounds: Normal heart sounds.  Pulmonary:     Effort: Pulmonary effort is normal. No respiratory distress.     Breath sounds: Normal breath sounds.  Abdominal:     General: Bowel sounds are normal. There is no distension.  Musculoskeletal:        General: Tenderness present. Normal range of motion.     Cervical back: Normal range of motion.     Right lower leg: No edema.     Left lower leg: No edema.     Comments: Nontender hip, no overlying erythema  Skin:    General: Skin is warm and dry.  Neurological:     General: No focal deficit present.     Mental Status: She is alert.     Comments:  Intact sensation Unable to ambulate secondary to pain Equal strength with plantarflexion  Psychiatric:        Mood and Affect: Mood normal.     Procedures  .Critical Care  Performed by: Linwood Dibbles, PA-C Authorized by: Linwood Dibbles, PA-C   Critical care provider statement:    Critical care time (minutes):  76   Critical care was necessary to treat or prevent imminent or life-threatening deterioration of the following conditions:  Metabolic crisis   Critical care was time spent personally by me on the following activities:  Development of treatment plan with patient or surrogate, discussions with consultants, evaluation of patient's response to treatment, examination of patient, ordering and review of laboratory studies, ordering and review of radiographic studies, ordering and performing treatments and interventions, pulse oximetry, re-evaluation of patient's condition and review of old charts  Labs Reviewed  COMPREHENSIVE METABOLIC PANEL - Abnormal; Notable for the following components:      Result Value   Sodium 116 (*)    Potassium 2.6 (*)    Chloride 75 (*)    Glucose, Bld 111 (*)    All other components  within normal limits  CBC WITH DIFFERENTIAL/PLATELET - Abnormal; Notable for the following components:   RBC 3.67 (*)    Hemoglobin 11.6 (*)    HCT 32.3 (*)    Abs Immature Granulocytes 0.08 (*)    All other components within normal limits  COMPREHENSIVE METABOLIC PANEL - Abnormal; Notable for the following components:   Sodium 116 (*)    Potassium 2.3 (*)    Chloride 76 (*)    Glucose, Bld 114 (*)    All other components within normal limits  OSMOLALITY - Abnormal; Notable for the following components:   Osmolality 247 (*)    All other components within normal limits  MAGNESIUM - Abnormal; Notable for the following components:   Magnesium 1.5 (*)    All other components within normal limits  SODIUM - Abnormal; Notable for the following components:    Sodium 117 (*)    All other components within normal limits  POTASSIUM - Abnormal; Notable for the following components:   Potassium 2.2 (*)    All other components within normal limits  URINALYSIS, ROUTINE W REFLEX MICROSCOPIC  SODIUM, URINE, RANDOM  OSMOLALITY, URINE   DG Hip Unilat W or Wo Pelvis 2-3 Views Right  Result Date: 02/09/2023 CLINICAL DATA:  Right hip pain shooting down the leg. EXAM: DG HIP (WITH OR WITHOUT PELVIS) 2-3V RIGHT COMPARISON:  None Available. FINDINGS: There is no evidence of hip fracture or dislocation. There is no evidence of arthropathy or other focal bone abnormality. IMPRESSION: Negative. Electronically Signed   By: Minerva Fester M.D.   On: 02/09/2023 21:36   MR LUMBAR SPINE WO CONTRAST  Result Date: 02/09/2023 CLINICAL DATA:  Low back pain and right leg pain for 4 days. EXAM: MRI LUMBAR SPINE WITHOUT CONTRAST TECHNIQUE: Multiplanar, multisequence MR imaging of the lumbar spine was performed. No intravenous contrast was administered. COMPARISON:  CT abdomen/pelvis 10/17/2021. FINDINGS: Segmentation: Standard; the lowest formed disc space is designated L5-S1. Alignment:  Normal. Vertebrae: Compression deformity of the L1 vertebral body with up to approximately 30% loss of vertebral body height anteriorly is unchanged since 2023, without marrow edema or bony retropulsion. The other vertebral body heights are preserved. Background marrow signal is normal. There is no suspicious marrow signal abnormality or marrow edema. Conus medullaris and cauda equina: Conus extends to the L1 level. Conus and cauda equina appear normal. Paraspinal and other soft tissues: Unremarkable. Disc levels: The disc heights are overall preserved with mild desiccation at T11-T12 and L3-L4. T12-L1: Mild right worse than left facet arthropathy without significant spinal canal or neural foraminal stenosis. L1-L2: There is a minimal disc bulge and minimal facet arthropathy without significant spinal  canal or neural foraminal stenosis. L2-L3: There is a shallow left foraminal/extraforaminal protrusion without significant spinal canal or neural foraminal stenosis. L3-L4: There is a mild disc bulge, mild bilateral facet arthropathy, and prominent dorsal epidural fat resulting in mild spinal canal stenosis and no significant neural foraminal stenosis. L4-L5: There is a mild disc bulge, mild bilateral facet arthropathy, and prominent dorsal epidural fat resulting in mild left subarticular zone narrowing without evidence of nerve root impingement, and mild-to-moderate right and mild left neural foraminal stenosis L5-S1: No significant spinal canal or neural foraminal stenosis. IMPRESSION: 1. 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 No acute osseous finding. 2. At L3-L4 there is mild spinal canal narrowing, and at L4-L5 there is mild left subarticular zone narrowing without evidence of impingement and mild-to-moderate right and mild left neural  foraminal stenosis due to mild disc bulges, mild facet arthropathy, and prominent dorsal epidural fat. No high-grade spinal canal or neural foraminal stenosis at any level. Electronically Signed   By: Lesia Hausen M.D.   On: 02/09/2023 19:02    ED Course / MDM   Clinical Course as of 02/09/23 2247  Fri Feb 09, 2023  1652 Patient reassessed.  Pending MRI.  Requesting additional dose of pain medicine.  She is neurovascularly intact.  No clinical evidence of VTE on exam.  No redness or warmth.  Patient states she has never had a back problem previously she follows with Dr. Bobby Rumpf with [facility] for hip pain however is never been seen for anyone for her back.  We discussed her significantly low sodium and potassium.  She was seen by her primary care provider 1 month ago had basic labs performed was noted to hyponatremia, she states that her dose of her HCTZ was cut in half.  She has not had a recheck of this. [BH]  2017 Went and discussed labs with  patient.  She states that her second round of labs were drawn off the IV despite blood nursing stated.  Will straight stick.  She has only received one half round of IV potassium at this point.  I discussed with Florentina Addison, paramedic to hold on any IV potassium until repeat straight stick labs have returned [BH]  2042 Straight stick labs which I witnessed are still persistently low, she will be treated with potassium, magnesium, thankfully no EKG changes. [BH]  2055 Consult with Dr. Jake Samples with neurosurgery.  We discussed her MRI.  Nothing to be done inpatient, pain control and she can follow-up outpatient for reassessment [BH]  2204 Dr. Maisie Fus with hospitalist with see for admission [BH]    Clinical Course User Index [BH] Akeem Heppler A, PA-C    Care assumed from previous provider, see note for full HPI.  In summation 52 year old history of chronic hip pain followed by Dr. Darrelyn Hillock with [facility] here for evaluation of right lower back pain with extension into her leg.  Was seen in the ED previously prescribed narcotics, pain management however no relief.  She was seen by [facility] today and there is concern for spinal etiology causing her pain and was sent here for further evaluation.  Patient with significant pain requiring multiple doses of IV narcotics.  Plan follow-up on MRI.  Of note screening labs were performed due to anticipation of admission for pain control noted to have hyponatremia and hypokalemia.  Plan to repeat these  Patient reassessed.  Pending MRI.  Her repeat labs show significant hyponatremia of 116 as well as hypokalemia, will add on hyponatremia labs as well as magnesium and EKG.  I confirmed with nurse Ellis Savage, RN) that her second blood draw was a straight stick, NOT pulled off of IV. I discussed this with patient who states approxi-1 month ago they were seen by their PCP noted to have new hyponatremia, unsure of the level had her HCTZ reduced.  Not had a recheck of this.  We  discussed this would require admission regardless of her imaging.  She is agreeable.  He does look very uncomfortable in the room will write for additional dose of pain medication.  She is pending her MRI.  Labs and imaging personally viewed and interpreted:  CBC without leukocytosis, hemoglobin 1.6 Metabolic panel hyponatremia 116, hypokalemia 2.3, chloride   Labs recheck she still has significant hyponatremia, hypokalemia, magnesium.  Visualized straight stick, believe this  is a true electrolyte abnormality.  She will be treated as such.  Thankfully no EKG changes.  With regard to her back pain I discussed with neurosurgery, Dr. Jake Samples.  States this is pain management she can follow-up outpatient.  She has no evidence of cauda equina here in the emergency department.  Symptoms do not seem consistent with VTE, AAA, dissection, vascular ischemia, septic joint, occult fracture  Patient will be admitted for significant electrolyte abnormalities  CONSULT with Dr. Maisie Fus with hospitalist.  Almira Coaster to evaluate patient for admission  The patient appears reasonably stabilized for admission considering the current resources, flow, and capabilities available in the ED at this time, and I doubt any other [facility] requiring further screening and/or treatment in the ED prior to admission.       Medical Decision Making Amount and/or Complexity of Data Reviewed Independent Historian: spouse External Data Reviewed: labs, radiology, ECG and notes. Labs: ordered. Decision-making details documented in ED Course. Radiology: ordered and independent interpretation performed. Decision-making details documented in ED Course. ECG/medicine tests: ordered and independent interpretation performed. Decision-making details documented in ED Course.  Risk OTC drugs. Prescription drug management. Parenteral controlled substances. Decision regarding hospitalization. Diagnosis or treatment significantly limited by social  determinants of health.    Hyponatremia Hypokalemia Hypomagnesemia Back pain       Storey Stangeland A, PA-C 02/09/23 2247    Arby Barrette, MD 02/09/23 2358

## 2023-02-09 NOTE — H&P (Signed)
History and Physical    [name] RUE:[id number] DOB: 03-Nov-1970 DOA: 02/09/2023  PCP: Farris Has, MD  Patient coming from: home  I have personally briefly reviewed patient's old medical records in [facility]  Chief Complaint:  right hip pain  radiating down leg x 6 days   HPI: [name] is a 52 y.o. female with medical history significant of GERD, Asthma, Hypertension, fibromyalgia, OSA, CHFpef, who presents to ED with interim history of being seen in ED 3 days ago for same complaint. At that time patient was diagnosed with sciatica and was discharged on Robaxin, Lidoderm patch, prednisone . Patient returns with persistent complaint of pain without improvement with prior given treatment. Patient notes s/p iv steroids pain has improved.  She notes no HA/nv/d/ abdominal pain , no feeling of confusion and no weakness.   ED Course:  Afeb, bp 126/58, hr 65, rr18, sat 98%  Labs: NA 116, K2.6, CL 75, glu 11, cr 0.62, mag 1.5 Hgb: 11.6, plt286,  JYN:WGNFAO rhythm , RAD Osmolality : 247 Xray hip: nad MRI lumbar spine IMPRESSION: 1. 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 No acute osseous finding. 2. At L3-L4 there is mild spinal canal narrowing, and at L4-L5 there is mild left subarticular zone narrowing without evidence of impingement and mild-to-moderate right and mild left neural foraminal stenosis due to mild disc bulges, mild facet arthropathy, and prominent dorsal epidural fat. No high-grade spinal canal or neural foraminal stenosis at any level. UA:neg Tx oxycodone, ketoralac,tylenol,diazepam, dialudid,zofran potassium 10 meq  Review of Systems: As per HPI otherwise 10 point review of systems negative.   Past Medical History:  Diagnosis Date   Acid reflux    Asthma    Essential hypertension    Fibromyalgia    History of cardiomyopathy    Nonischemic 2011, LVEF 45-50%   History of migraine headaches    OSA (obstructive  sleep apnea)     Past Surgical History:  Procedure Laterality Date   CESAREAN SECTION     HUMERUS FRACTURE SURGERY Left    LIPOSUCTION     Skin cyst removal     stab phlebectomy Left 02/08/2022   stab phlebectomy 10-20 incisions left leg by Cari Caraway MD   TONSILLECTOMY AND ADENOIDECTOMY     TYMPANOTOMY       reports that she has never smoked. She has never used smokeless tobacco. She reports current alcohol use. She reports that she does not use drugs.  Allergies  Allergen Reactions   Valsartan-Hydrochlorothiazide Swelling    Face swelling   Bupropion     Other reaction(s): HA, nausea   Buspirone Hcl     Other reaction(s): irritability   Citalopram Hydrobromide     Other reaction(s): irritability   Gabapentin     Other reaction(s): swelling & dizziness   Iodine-131 Other (See Comments)    other   Iohexol Hives   Venlafaxine     Other reaction(s): Hair Loss    Family History  Problem Relation Age of Onset   Emphysema Father        Tobacco use   Peripheral Artery Disease Father    Emphysema Paternal Grandmother        Tobacco use   Rheum arthritis Paternal Grandmother    Diabetes Paternal Grandmother    Hypertension Paternal Grandmother    Fibromyalgia Paternal Grandmother    COPD Paternal Grandmother    Asthma Brother    Lung cancer Paternal Grandfather  Tobacco use   Cancer - Lung Paternal Grandfather    Atrial fibrillation Mother    Aneurysm Maternal Grandmother     Prior to Admission medications   Medication Sig Start Date End Date Taking? Authorizing Provider  ALPRAZolam Prudy Feeler) 0.25 MG tablet Take up to 2 tablets by mouth every 4 hours prn. Patient taking differently: Take 0.25-0.5 mg by mouth every 4 (four) hours as needed for anxiety. 10/26/14  Yes Hommel, Sean, DO  carvedilol (COREG) 12.5 MG tablet Take 1 tablet (12.5 mg total) by mouth 2 (two) times daily. 10/27/22 10/27/23 Yes Jonelle Sidle, MD  Cholecalciferol (D 1000) 25 MCG (1000  UT) capsule Take 1,000 Units by mouth daily. 06/05/22  Yes [provider]  doxycycline (DORYX) 100 MG EC tablet Take 100 mg by mouth daily.   Yes [provider]  DULoxetine (CYMBALTA) 60 MG capsule Take 60 mg by mouth daily.   Yes [provider]  estradiol (VIVELLE-DOT) 0.05 MG/24HR patch Place 1 patch onto the skin 2 (two) times a week.   Yes [provider]  furosemide (LASIX) 20 MG tablet Take 20 mg by mouth daily as needed for fluid or edema.   Yes [provider]  indomethacin (INDOCIN) 25 MG capsule Take 50 mg by mouth 2 (two) times daily. 12/03/21  Yes [provider]  lansoprazole (PREVACID) 15 MG capsule Take 30 mg by mouth daily. 04/13/20  Yes [provider]  lidocaine (LIDODERM) 5 % Place 1 patch onto the skin daily. Remove & Discard patch within 12 hours or as directed by MD 02/06/23  Yes Blue, Soijett A, PA-C  methocarbamol (ROBAXIN) 500 MG tablet Take 1 tablet (500 mg total) by mouth 2 (two) times daily. 02/06/23  Yes Blue, Soijett A, PA-C  oxyCODONE-acetaminophen (PERCOCET/ROXICET) 5-325 MG tablet Take 1 tablet by mouth every 6 (six) hours as needed for severe pain. 02/06/23  Yes Blue, Soijett A, PA-C  potassium chloride (KLOR-CON) 10 MEQ tablet Take 1 tablet (10 mEq total) by mouth daily. 10/27/22  Yes Jonelle Sidle, MD  predniSONE (DELTASONE) 20 MG tablet Take 2 tablets (40 mg total) by mouth daily for 5 days. 02/06/23 02/11/23 Yes Blue, Soijett A, PA-C  progesterone (PROMETRIUM) 100 MG capsule Take 100 mg by mouth every evening. 01/08/23  Yes [provider]  SUMAtriptan (IMITREX) 100 MG tablet Take 100 mg by mouth every 2 (two) hours as needed for migraine or headache.   Yes [provider]  zolpidem (AMBIEN) 10 MG tablet Take 10 mg by mouth at bedtime as needed for sleep.   Yes [provider]  zonisamide (ZONEGRAN) 100 MG capsule Take 1 capsule (100 mg total) by mouth daily. 10/26/14  Yes Hommel,  Sean, DO  amLODipine (NORVASC) 5 MG tablet Take 1 tablet (5 mg total) by mouth daily. Patient not taking: Reported on 02/09/2023 02/16/22 02/11/23  [place] Ouch, Lennart Pall, PA-C  hydrochlorothiazide (HYDRODIURIL) 25 MG tablet Take 1 tablet by mouth daily. Patient not taking: Reported on 02/09/2023 12/11/22   Jonelle Sidle, MD  LORazepam (ATIVAN) 1 MG tablet Take 1 tablet 30 minutes prior to leaving house on day of office surgery.  Bring second tablet with you to office on day of office surgery. Patient not taking: Reported on 02/09/2023 02/02/22   Chuck Hint, MD    Physical Exam: Vitals:   02/09/23 1740 02/09/23 1745 02/09/23 1836 02/09/23 2110  BP:    (!) 128/108  Pulse: 67   65  Resp: 14 19  14   Temp:   97.6 F (36.4 C)   TempSrc:   Oral   SpO2: 96%   100%  Weight:      Height:        Constitutional: NAD, calm, comfortable Vitals:   02/09/23 1740 02/09/23 1745 02/09/23 1836 02/09/23 2110  BP:    (!) 128/108  Pulse: 67   65  Resp: 14 19  14   Temp:   97.6 F (36.4 C)   TempSrc:   Oral   SpO2: 96%   100%  Weight:      Height:       Eyes: PERRL, lids and conjunctivae normal ENMT: Mucous membranes are moist. Posterior pharynx clear of any exudate or lesions.Normal dentition.  Neck: normal, supple, no masses, no thyromegaly Respiratory: clear to auscultation bilaterally, no wheezing, no crackles. Normal respiratory effort. No accessory muscle use.  Cardiovascular: Regular rate and rhythm, no murmurs / rubs / gallops. No extremity edema. 2+ pedal pulses. No carotid bruits.  Abdomen: no tenderness, no masses palpated. No hepatosplenomegaly. Bowel sounds positive.  Musculoskeletal: no clubbing / cyanosis. No joint deformity upper and lower extremities. Decrease ROM due to pain on left lower ext,  no contractures. Normal muscle tone.  Skin: no rashes, lesions, ulcers. No induration Neurologic: CN 2-12 grossly intact. Sensation intact,l. MAE x 4, .  Psychiatric: Normal judgment  and insight. Alert and oriented x 3. Normal mood.    Labs on Admission: I have personally reviewed following labs and imaging studies  CBC: Recent Labs  Lab 02/09/23 1513  WBC 6.0  NEUTROABS 3.1  HGB 11.6*  HCT 32.3*  MCV 88.0  PLT 286   Basic Metabolic Panel: Recent Labs  Lab 02/09/23 1351 02/09/23 1513 02/09/23 1940  NA 116* 116* 117*  K 2.6* 2.3* 2.2*  CL 75* 76*  --   CO2 28 27  --   GLUCOSE 111* 114*  --   BUN 12 12  --   CREATININE 0.62 0.69  --   CALCIUM 9.3 8.9  --   MG  --  1.5*  --    GFR: Estimated Creatinine Clearance: 79.7 mL/min (by C-G formula based on SCr of 0.69 mg/dL). Liver Function Tests: Recent Labs  Lab 02/09/23 1351 02/09/23 1513  AST 34 26  ALT 19 18  ALKPHOS 64 60  BILITOT 1.0 1.0  PROT 7.5 6.5  ALBUMIN 4.1 3.8   No results for input(s): "LIPASE", "AMYLASE" in the last 168 hours. No results for input(s): "AMMONIA" in the last 168 hours. Coagulation Profile: No results for input(s): "INR", "PROTIME" in the last 168 hours. Cardiac Enzymes: No results for input(s): "CKTOTAL", "CKMB", "CKMBINDEX", "TROPONINI" in the last 168 hours. BNP (last 3 results) No results for input(s): "PROBNP" in the last 8760 hours. HbA1C: No results for input(s): "HGBA1C" in the last 72 hours. CBG: No results for input(s): "GLUCAP" in the last 168 hours. Lipid Profile: No results for input(s): "CHOL", "HDL", "LDLCALC", "TRIG", "CHOLHDL", "LDLDIRECT" in the last 72 hours. Thyroid Function Tests: No results for input(s): "TSH", "T4TOTAL", "FREET4", "T3FREE", "THYROIDAB" in the last 72 hours. Anemia Panel: No results for input(s): "VITAMINB12", "FOLATE", "FERRITIN", "TIBC", "IRON", "RETICCTPCT" in the last 72 hours. Urine analysis:    Component Value Date/Time   COLORURINE YELLOW 02/09/2023 1723   APPEARANCEUR CLEAR 02/09/2023 1723   LABSPEC 1.008 02/09/2023 1723   PHURINE 6.0 02/09/2023 1723   GLUCOSEU NEGATIVE 02/09/2023 1723   HGBUR NEGATIVE  02/09/2023 1723  BILIRUBINUR NEGATIVE 02/09/2023 1723   KETONESUR NEGATIVE 02/09/2023 1723   PROTEINUR NEGATIVE 02/09/2023 1723   NITRITE NEGATIVE 02/09/2023 1723   LEUKOCYTESUR NEGATIVE 02/09/2023 1723    Radiological Exams on Admission: DG Hip Unilat W or Wo Pelvis 2-3 Views Right  Result Date: 02/09/2023 CLINICAL DATA:  Right hip pain shooting down the leg. EXAM: DG HIP (WITH OR WITHOUT PELVIS) 2-3V RIGHT COMPARISON:  None Available. FINDINGS: There is no evidence of hip fracture or dislocation. There is no evidence of arthropathy or other focal bone abnormality. IMPRESSION: Negative. Electronically Signed   By: Minerva Fester M.D.   On: 02/09/2023 21:36   MR LUMBAR SPINE WO CONTRAST  Result Date: 02/09/2023 CLINICAL DATA:  Low back pain and right leg pain for 4 days. EXAM: MRI LUMBAR SPINE WITHOUT CONTRAST TECHNIQUE: Multiplanar, multisequence MR imaging of the lumbar spine was performed. No intravenous contrast was administered. COMPARISON:  CT abdomen/pelvis 10/17/2021. FINDINGS: Segmentation: Standard; the lowest formed disc space is designated L5-S1. Alignment:  Normal. Vertebrae: Compression deformity of the L1 vertebral body with up to approximately 30% loss of vertebral body height anteriorly is unchanged since 2023, without marrow edema or bony retropulsion. The other vertebral body heights are preserved. Background marrow signal is normal. There is no suspicious marrow signal abnormality or marrow edema. Conus medullaris and cauda equina: Conus extends to the L1 level. Conus and cauda equina appear normal. Paraspinal and other soft tissues: Unremarkable. Disc levels: The disc heights are overall preserved with mild desiccation at T11-T12 and L3-L4. T12-L1: Mild right worse than left facet arthropathy without significant spinal canal or neural foraminal stenosis. L1-L2: There is a minimal disc bulge and minimal facet arthropathy without significant spinal canal or neural foraminal  stenosis. L2-L3: There is a shallow left foraminal/extraforaminal protrusion without significant spinal canal or neural foraminal stenosis. L3-L4: There is a mild disc bulge, mild bilateral facet arthropathy, and prominent dorsal epidural fat resulting in mild spinal canal stenosis and no significant neural foraminal stenosis. L4-L5: There is a mild disc bulge, mild bilateral facet arthropathy, and prominent dorsal epidural fat resulting in mild left subarticular zone narrowing without evidence of nerve root impingement, and mild-to-moderate right and mild left neural foraminal stenosis L5-S1: No significant spinal canal or neural foraminal stenosis. IMPRESSION: 1. 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 No acute osseous finding. 2. At L3-L4 there is mild spinal canal narrowing, and at L4-L5 there is mild left subarticular zone narrowing without evidence of impingement and mild-to-moderate right and mild left neural foraminal stenosis due to mild disc bulges, mild facet arthropathy, and prominent dorsal epidural fat. No high-grade spinal canal or neural foraminal stenosis at any level. Electronically Signed   By: Lesia Hausen M.D.   On: 02/09/2023 19:02    EKG: Independently reviewed. See above  Assessment/Plan  Hypo-osmolar hyponatremia ` -in setting of HCTZ use -hold hctz  -ivfs ns at 50 -q4h NA  -renal consult for further assistance  -monitor neuro checks    Sciatica with refractory pain  -chronic compression of L1 vertebrae -L3-L4 mild spinal canal narrowing -L4-L5 mild narrowing w/o impingement  -solumedrol x 1 now  -po prednisone taper  -continue on robaxin  -PT to see   Hypomagnesemia  Hypokalemia -replete prn    CHFpef -ef 50%  -continue on carvedilol  -strict I/o  -monitor weights daily   GERD -ppi    Asthma -no acute exacerbation  - prn nebs    Hypertension - continue on carvedilol  Fibromyalgia  Chronic pain -continue on ssri     OSA -cpap qhs    DVT prophylaxis: heparin Code Status: full/ as discussed per patient wishes in event of cardiac arrest Family Communication:  Disposition Plan: patient  expected to be admitted greater than 2 midnights  Consults called:  Renal  Admission status: SDU   Lurline Del MD [facility]   If 7PM-7AM, please contact night-coverage [email or web address] Password [facility]  02/09/2023, 10:39 PM

## 2023-02-10 ENCOUNTER — Encounter (HOSPITAL_COMMUNITY): Payer: Self-pay | Admitting: Internal Medicine

## 2023-02-10 DIAGNOSIS — E871 Hypo-osmolality and hyponatremia: Secondary | ICD-10-CM | POA: Diagnosis not present

## 2023-02-10 LAB — TSH
TSH: 1.149 u[IU]/mL (ref 0.350–4.500)
TSH: 0.746 u[IU]/mL (ref 0.350–4.500)

## 2023-02-10 LAB — HIV ANTIBODY (ROUTINE TESTING W REFLEX): HIV Screen 4th Generation wRfx: NONREACTIVE

## 2023-02-10 LAB — COMPREHENSIVE METABOLIC PANEL
ALT: 16 U/L (ref 0–44)
AST: 25 U/L (ref 15–41)
Albumin: 3.5 g/dL (ref 3.5–5.0)
Alkaline Phosphatase: 54 U/L (ref 38–126)
Anion gap: 11 (ref 5–15)
BUN: 13 mg/dL (ref 6–20)
CO2: 28 mmol/L (ref 22–32)
Calcium: 9.1 mg/dL (ref 8.9–10.3)
Chloride: 84 mmol/L — ABNORMAL LOW (ref 98–111)
Creatinine, Ser: 0.58 mg/dL (ref 0.44–1.00)
GFR, Estimated: >60 mL/min (ref >60)
Glucose, Bld: 123 mg/dL — ABNORMAL HIGH (ref 70–99)
Potassium: 2.8 mmol/L — ABNORMAL LOW (ref 3.5–5.1)
Sodium: 123 mmol/L — ABNORMAL LOW (ref 135–145)
Total Bilirubin: 0.9 mg/dL (ref 0.3–1.2)
Total Protein: 6.6 g/dL (ref 6.5–8.1)

## 2023-02-10 LAB — CBC
HCT: 34.2 % — ABNORMAL LOW (ref 36.0–46.0)
Hemoglobin: 12.1 g/dL (ref 12.0–15.0)
MCH: 32.1 pg (ref 26.0–34.0)
MCHC: 35.4 g/dL (ref 30.0–36.0)
MCV: 90.7 fL (ref 80.0–100.0)
Platelets: 300 10*3/uL (ref 150–400)
RBC: 3.77 MIL/uL — ABNORMAL LOW (ref 3.87–5.11)
RDW: 12.2 % (ref 11.5–15.5)
WBC: 5.6 10*3/uL (ref 4.0–10.5)
nRBC: 0 % (ref 0.0–0.2)

## 2023-02-10 LAB — SODIUM
Sodium: 129 mmol/L — ABNORMAL LOW (ref 135–145)
Sodium: 127 mmol/L — ABNORMAL LOW (ref 135–145)

## 2023-02-10 LAB — RENAL FUNCTION PANEL
Albumin: 3.7 g/dL (ref 3.5–5.0)
Anion gap: 9 (ref 5–15)
BUN: 15 mg/dL (ref 6–20)
CO2: 28 mmol/L (ref 22–32)
Calcium: 9.4 mg/dL (ref 8.9–10.3)
Chloride: 90 mmol/L — ABNORMAL LOW (ref 98–111)
Creatinine, Ser: 0.56 mg/dL (ref 0.44–1.00)
GFR, Estimated: >60 mL/min (ref >60)
Glucose, Bld: 136 mg/dL — ABNORMAL HIGH (ref 70–99)
Phosphorus: 2.1 mg/dL — ABNORMAL LOW (ref 2.5–4.6)
Potassium: 3.4 mmol/L — ABNORMAL LOW (ref 3.5–5.1)
Sodium: 127 mmol/L — ABNORMAL LOW (ref 135–145)

## 2023-02-10 LAB — MAGNESIUM: Magnesium: 2.3 mg/dL (ref 1.7–2.4)

## 2023-02-10 LAB — CORTISOL: Cortisol, Plasma: 3.7 ug/dL

## 2023-02-10 LAB — NA AND K (SODIUM & POTASSIUM), RAND UR
Potassium Urine: 12 mmol/L
Sodium, Ur: <10 mmol/L

## 2023-02-10 LAB — MRSA NEXT GEN BY PCR, NASAL: MRSA by PCR Next Gen: NOT DETECTED

## 2023-02-10 MED ORDER — POTASSIUM CHLORIDE CRYS ER 20 MEQ PO TBCR
40.0000 meq | EXTENDED_RELEASE_TABLET | Freq: Every day | ORAL | Status: DC
  Administered 2023-02-10 – 2023-02-12 (×3): 40 meq via ORAL
  Filled 2023-02-10 (×3): qty 2

## 2023-02-10 MED ORDER — DIAZEPAM 2 MG PO TABS
2.0000 mg | ORAL_TABLET | Freq: Three times a day (TID) | ORAL | Status: DC | PRN
  Administered 2023-02-10 – 2023-02-11 (×3): 2 mg via ORAL
  Filled 2023-02-10 (×3): qty 1

## 2023-02-10 MED ORDER — SODIUM CHLORIDE 0.9 % IV SOLN: INTRAVENOUS | Status: DC | PRN

## 2023-02-10 MED ORDER — ACETAMINOPHEN 500 MG PO TABS
1000.0000 mg | ORAL_TABLET | Freq: Once | ORAL | Status: AC
  Administered 2023-02-10: 1000 mg via ORAL
  Filled 2023-02-10: qty 2

## 2023-02-10 MED ORDER — PANTOPRAZOLE SODIUM 40 MG PO TBEC
40.0000 mg | DELAYED_RELEASE_TABLET | Freq: Once | ORAL | Status: AC
  Administered 2023-02-10: 40 mg via ORAL
  Filled 2023-02-10: qty 1

## 2023-02-10 MED ORDER — POTASSIUM CHLORIDE IN NACL 40-0.9 MEQ/L-% IV SOLN
INTRAVENOUS | Status: DC
  Filled 2023-02-10: qty 1000

## 2023-02-10 MED ORDER — POTASSIUM CHLORIDE 10 MEQ/100ML IV SOLN: 10.0000 meq | Freq: Once | INTRAVENOUS | Status: AC

## 2023-02-10 MED ORDER — POTASSIUM CHLORIDE 10 MEQ/100ML IV SOLN
INTRAVENOUS | Status: AC
  Administered 2023-02-10: 10 meq via INTRAVENOUS
  Filled 2023-02-10: qty 100

## 2023-02-10 MED ORDER — PREGABALIN 75 MG PO CAPS
75.0000 mg | ORAL_CAPSULE | Freq: Every day | ORAL | Status: DC
  Administered 2023-02-11 – 2023-02-12 (×2): 75 mg via ORAL
  Filled 2023-02-10 (×2): qty 1

## 2023-02-10 MED ORDER — LIDOCAINE 5 % EX PTCH
1.0000 | MEDICATED_PATCH | CUTANEOUS | Status: DC
  Administered 2023-02-10 – 2023-02-12 (×2): 1 via TRANSDERMAL
  Filled 2023-02-10 (×2): qty 1

## 2023-02-10 MED ORDER — INDOMETHACIN ER 75 MG PO CPCR
75.0000 mg | ORAL_CAPSULE | Freq: Every day | ORAL | Status: DC
  Administered 2023-02-11 – 2023-02-12 (×2): 75 mg via ORAL
  Filled 2023-02-10 (×2): qty 1

## 2023-02-10 MED ORDER — ALUM & MAG HYDROXIDE-SIMETH 200-200-20 MG/5ML PO SUSP
30.0000 mL | ORAL | Status: DC | PRN
  Administered 2023-02-10: 30 mL via ORAL
  Filled 2023-02-10: qty 30

## 2023-02-10 MED ORDER — PREGABALIN 75 MG PO CAPS
75.0000 mg | ORAL_CAPSULE | Freq: Once | ORAL | Status: DC
  Filled 2023-02-10: qty 1

## 2023-02-10 MED ORDER — POTASSIUM CHLORIDE CRYS ER 20 MEQ PO TBCR
40.0000 meq | EXTENDED_RELEASE_TABLET | Freq: Once | ORAL | Status: AC
  Administered 2023-02-10: 40 meq via ORAL
  Filled 2023-02-10: qty 2

## 2023-02-10 MED ORDER — AMLODIPINE BESYLATE 5 MG PO TABS
5.0000 mg | ORAL_TABLET | Freq: Every day | ORAL | Status: DC
  Administered 2023-02-10 – 2023-02-11 (×2): 5 mg via ORAL
  Filled 2023-02-10 (×2): qty 1

## 2023-02-10 MED ORDER — POTASSIUM CHLORIDE 10 MEQ/100ML IV SOLN
10.0000 meq | INTRAVENOUS | Status: AC
  Administered 2023-02-10 (×2): 10 meq via INTRAVENOUS
  Filled 2023-02-10 (×2): qty 100

## 2023-02-10 MED ORDER — SODIUM CHLORIDE 0.9 % IV SOLN: INTRAVENOUS | Status: DC

## 2023-02-10 MED ORDER — SUMATRIPTAN SUCCINATE 50 MG PO TABS: 100.0000 mg | ORAL_TABLET | ORAL | Status: DC | PRN

## 2023-02-10 MED ORDER — HYDRALAZINE HCL 20 MG/ML IJ SOLN: 5.0000 mg | INTRAMUSCULAR | Status: DC | PRN

## 2023-02-10 NOTE — Progress Notes (Signed)
Orthopedic Tech Progress Note Patient Details:  [name] Feb 04, 1971 [id number]  Ortho Devices Type of Ortho Device: Prafo boot/shoe Ortho Device/Splint Location: RLE Ortho Device/Splint Interventions: Ordered, Application   Post Interventions Patient Tolerated: Well Instructions Provided: Adjustment of device, Care of device, Poper ambulation with device  Kaedan Richert 02/10/2023, 12:22 PM

## 2023-02-10 NOTE — Progress Notes (Signed)
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 Nada Maclachlan Thoracolumbar joint pain?   followed by Dr. Tinnie Gens neurology Migraines-- previously followed with Dr. Lorenda Ishihara [facility] clinic fprevious trochanteric bursa injections etc. for IT band syndrome ?  Raynaud's IBS gerd Varicose veins/telangiectasia followed by Dr. Randie Heinz of vascular  Seen in emergency room 02/06/2023 Rx for sciatica Robaxin lidocaine patch prednisone-return with increasing pain-IV steroids seem to help  On evaluation profound hyponatremia Sodium 116 potassium 2.6, UOsm 247 Usod less than 10 Hip x-ray negative MRI lumbar = L3-L4, L4-L5 mild narrowing no impingement mild foraminal stenosis Rx oxycodone Toradol Tylenol diazepam Dilaudid Admitted for severe hyponatremia to SDU-renal consulted  Dr. Jake Samples was consulted on admission and did not recommend any invasive or other measures for treatment  Hospital-Problem based course  Hypoosmolar hyponatremia 2/2 thiazide diuretic,?  SSRI Appreciate nephrology insight-fluids 75 cc/H+ K40 currently Not requiring 3% saline Hold home Lasix Would not use thiazide in the future-alternate anti-HTN--?  Beta-blocker as outpatient when discussed with PCP  Hypomagnesemia hypokalemia- Fluids as above-add K. Dur 40 twice daily-liberalize diet to include salt Magnesium normalized after aggressive repletion   Sciatica, prior trochanteric bursa injections currently Cymbalta being used in the outpatient Mild foot drop on right side--neurosurgery aware and does not require their services at this time Pain is moderate to severe she has been pushing through the pain-continue steroid burst prednisone X  2 days Robaxin 500 twice daily--- would give low-dose valium 2 mg every 8 as needed spasm--continue Lyrica 75 and titrate upwards if beneficial--add back Lidoderm patch Toradol/indomethacin all held secondary to hyponatremia PT to see-foot drop splint ordered  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 if she wishes-appears was not taking  History migraine Can continue Imitrex 100 acute as needed  Records indicate she does NOT have fibromyalgia per [facility] clinic notes  Asthma OSA Nightly CPAP as needed-offered tonight Albuterol 2.5 every 2 as needed as needed  Mild insomnia Continue zolpidem/Zonegran as needed  Reflux ?  Raynaud's ?  IBS/GERD Varicose veins--- all of the above are stable and require outpatient routine follow-up   DVT prophylaxis: Heparin Code Status: Full Family Communication: Husband at bedside Disposition:  Status is: Inpatient Remains inpatient appropriate because:   Requires improvement sodium Requires PT OT    Subjective: Awake coherent no distress no icterus no pallor no rales no rhonchi Main complaint lower back pain debility in her leg-expresses worry about her new job and fear that she does not have insurance until July 1   Objective: Vitals:   02/10/23 0425 02/10/23 0500 02/10/23 0600 02/10/23 0700  BP:  (!) 127/48 (!) 104/39 (!) 118/54  Pulse: 74 75 73 80  Resp: 15 15 12 19   Temp:      TempSrc:      SpO2: 98% 99% 100% 95%  Weight:  78.2 kg    Height:        Intake/Output Summary (Last 24 hours) at 02/10/2023 0753 Last data filed at 02/10/2023 0749 Gross per 24 hour  Intake 703.81 ml  Output --  Net 703.81 ml   Filed Weights   02/09/23 1057 02/10/23 0010 02/10/23 0500  Weight: 74.8 kg 78.2 kg 78.2 kg    Examination:  EOMI NCAT Sleepy appearing Chest clear no wheeze Abdomen soft S1-S2 sinus rhythm with PVCs on monitor No lower extremity  edema-tattoo on left foot    Data Reviewed: personally reviewed   CBC    Component Value Date/Time   WBC 5.6 02/10/2023 0424   RBC 3.77 (L) 02/10/2023 0424   HGB 12.1 02/10/2023 0424   HCT 34.2 (L) 02/10/2023 0424   PLT 300 02/10/2023 0424   MCV 90.7 02/10/2023 0424   MCH 32.1 02/10/2023 0424   MCHC 35.4 02/10/2023 0424   RDW 12.2 02/10/2023 0424   LYMPHSABS 1.9 02/09/2023 1513   MONOABS 0.8 02/09/2023 1513   EOSABS 0.1 02/09/2023 1513   BASOSABS 0.0 02/09/2023 1513      Latest Ref Rng & Units 02/10/2023    4:24 AM 02/09/2023    7:40 PM 02/09/2023    3:13 PM  CMP  Glucose 70 - 99 mg/dL 161   096   BUN 6 - 20 mg/dL 13   12   Creatinine 0.45 - 1.00 mg/dL 4.09   8.11   Sodium 914 - 145 mmol/L 123  117  116   Potassium 3.5 - 5.1 mmol/L 2.8  2.2  2.3   Chloride 98 - 111 mmol/L 84   76   CO2 22 - 32 mmol/L 28   27   Calcium 8.9 - 10.3 mg/dL 9.1   8.9   Total Protein 6.5 - 8.1 g/dL 6.6   6.5   Total Bilirubin 0.3 - 1.2 mg/dL 0.9   1.0   Alkaline Phos 38 - 126 U/L 54   60   AST 15 - 41 U/L 25   26   ALT 0 - 44 U/L 16   18      Radiology Studies: DG Hip Unilat W or Wo Pelvis 2-3 Views Right  Result Date: 02/09/2023 CLINICAL DATA:  Right hip pain shooting down the leg. EXAM: DG HIP (WITH OR WITHOUT PELVIS) 2-3V RIGHT COMPARISON:  None Available. FINDINGS: There is no evidence of hip fracture or dislocation. There is no evidence of arthropathy or other focal bone abnormality. IMPRESSION: Negative. Electronically Signed   By: Minerva Fester M.D.   On: 02/09/2023 21:36   MR LUMBAR SPINE WO CONTRAST  Result Date: 02/09/2023 CLINICAL DATA:  Low back pain and right leg pain for 4 days. EXAM: MRI LUMBAR SPINE WITHOUT CONTRAST TECHNIQUE: Multiplanar, multisequence MR imaging of the lumbar spine was performed. No intravenous contrast was administered. COMPARISON:  CT abdomen/pelvis 10/17/2021. FINDINGS: Segmentation: Standard; the lowest formed disc space is designated L5-S1. Alignment:   Normal. Vertebrae: Compression deformity of the L1 vertebral body with up to approximately 30% loss of vertebral body height anteriorly is unchanged since 2023, without marrow edema or bony retropulsion. The other vertebral body heights are preserved. Background marrow signal is normal. There is no suspicious marrow signal abnormality or marrow edema. Conus medullaris and cauda equina: Conus extends to the L1 level. Conus and cauda equina appear normal. Paraspinal and other soft tissues: Unremarkable. Disc levels: The disc heights are overall preserved with mild desiccation at T11-T12 and L3-L4. T12-L1: Mild right worse than left facet arthropathy without significant spinal canal or neural foraminal stenosis. L1-L2: There is a minimal disc bulge and minimal facet arthropathy without significant spinal canal or neural foraminal stenosis. L2-L3: There is a shallow left foraminal/extraforaminal protrusion without significant spinal canal or neural foraminal stenosis. L3-L4: There is  a mild disc bulge, mild bilateral facet arthropathy, and prominent dorsal epidural fat resulting in mild spinal canal stenosis and no significant neural foraminal stenosis. L4-L5: There is a mild disc bulge, mild bilateral facet arthropathy, and prominent dorsal epidural fat resulting in mild left subarticular zone narrowing without evidence of nerve root impingement, and mild-to-moderate right and mild left neural foraminal stenosis L5-S1: No significant spinal canal or neural foraminal stenosis. IMPRESSION: 1. 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 No acute osseous finding. 2. At L3-L4 there is mild spinal canal narrowing, and at L4-L5 there is mild left subarticular zone narrowing without evidence of impingement and mild-to-moderate right and mild left neural foraminal stenosis due to mild disc bulges, mild facet arthropathy, and prominent dorsal epidural fat. No high-grade spinal canal or neural foraminal  stenosis at any level. Electronically Signed   By: Lesia Hausen M.D.   On: 02/09/2023 19:02     Scheduled Meds:  carvedilol  12.5 mg Oral BID   Chlorhexidine Gluconate Cloth  6 each Topical Q0600   cholecalciferol  1,000 Units Oral Daily   DULoxetine  60 mg Oral Daily   heparin  5,000 Units Subcutaneous Q8H   methocarbamol  500 mg Oral BID   pantoprazole  40 mg Oral Daily   potassium chloride  10 mEq Oral Daily   predniSONE  40 mg Oral Q breakfast   pregabalin  75 mg Oral Once   zonisamide  100 mg Oral Daily   Continuous Infusions:  sodium chloride Stopped (02/10/23 0507)   sodium chloride 5 mL/hr at 02/10/23 0749   0.9 % NaCl with KCl 40 mEq / L     potassium chloride 10 mEq (02/10/23 0752)     LOS: 1 day   Time spent: 65  Rhetta Mura, MD [facility] To contact the attending provider between 7A-7P or the covering provider during after hours 7P-7A, please log into the web site [email or web address] and access using universal [place] password for that web site. If you do not have the password, please call the hospital operator.  02/10/2023, 7:53 AM

## 2023-02-10 NOTE — Consult Note (Signed)
Reason for Consult: Hyponatremia Referring Physician: Dr. Skip Mayer  Chief Complaint: Right hip pain  Assessment/Plan: Hyponatremia - Patient had her HCTZ stopped 3 weeks ago but has taken Goody's powder since d/c from ED 3 days ago. Of note the urine osmolality is 177 which is less than the calculated serum osmolality and urine Na <10.  The patient is not hypervolemic by exam or history and has not been able to keep anything down PO with nausea + vomiting. 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 - 3% not necessary as repeat Na 123; will challenge with more isotonic fluids as this does not appear to be primarily SIADH driven with the UNa <10% and Uosm <177. Restart NS at 75 ml/hr and will also check a urine Na and K. Replete the K aggressively. - Will complete w/u with cortisol and TSH. - Will follow closely with you. HFpEF - appears to be compensated HTN - resume home regimen Sciatica  HPI: [name] is an 52 y.o. female HTN, OSA, HFpEF, asthma presenting with right hip pain radiating down the leg for the past 6 days. She was actually seen for the same complaint 3 days prior in the ED diagnosed with sciatica and discharged with robaxin, Lidoderm patch and prednisone. Patient states that she had no improvement in pain with the above mentioned regimen. Patient denies any fall, trauma or lifting injury and has taken ibuprofen at home for the pain. She has not had a history of sciatica previously. She denies bowel/ bladder incontinence, obstructive like symptoms, saddle paresthesia or fevers. Patient has had nausea for the past few days and has not been able to keep anything down. Very poor oral intake the past few days.  ROS Pertinent items are noted in HPI.  Chemistry and CBC: Creatinine, Ser  Date/Time Value Ref Range Status  02/10/2023 04:24 AM 0.58 0.44  - 1.00 mg/dL Final  16/06/9603 54:09 PM 0.69 0.44 - 1.00 mg/dL Final  81/19/1478 29:56 PM 0.62 0.44 - 1.00 mg/dL Final  21/30/8657 84:69 PM 0.55 0.44 - 1.00 mg/dL Final  62/95/2841 32:44 PM 0.70 0.57 - 1.00 mg/dL Final  09/06/7251 66:44 PM 0.73 0.44 - 1.00 mg/dL Final   Recent Labs  Lab 02/09/23 1351 02/09/23 1513 02/09/23 1940 02/10/23 0424  NA 116* 116* 117* 123*  K 2.6* 2.3* 2.2* 2.8*  CL 75* 76*  --  84*  CO2 28 27  --  28  GLUCOSE 111* 114*  --  123*  BUN 12 12  --  13  CREATININE 0.62 0.69  --  0.58  CALCIUM 9.3 8.9  --  9.1   Recent Labs  Lab 02/09/23 1513 02/10/23 0424  WBC 6.0 5.6  NEUTROABS 3.1  --   HGB 11.6* 12.1  HCT 32.3* 34.2*  MCV 88.0 90.7  PLT 286 300   Liver Function Tests: Recent Labs  Lab 02/09/23 1351 02/09/23 1513 02/10/23 0424  AST 34 26 25  ALT 19 18 16   ALKPHOS 64 60 54  BILITOT 1.0 1.0 0.9  PROT 7.5 6.5 6.6  ALBUMIN 4.1 3.8 3.5   No results for input(s): "LIPASE", "AMYLASE" in the last 168 hours. No results for input(s): "AMMONIA" in the last 168 hours. Cardiac Enzymes: No results for input(s): "CKTOTAL", "CKMB", "CKMBINDEX", "TROPONINI" in the last 168 hours. Iron Studies: No results for input(s): "IRON", "TIBC", "TRANSFERRIN", "FERRITIN" in  the last 72 hours. PT/INR: @LABRCNTIP (inr:5)  Xrays/Other Studies: ) Results for orders placed or performed during the hospital encounter of 02/09/23 (from the past 48 hour(s))  Comprehensive metabolic panel     Status: Abnormal   Collection Time: 02/09/23  1:51 PM  Result Value Ref Range   Sodium 116 (LL) 135 - 145 mmol/L    Comment: CRITICAL RESULT CALLED TO, READ BACK BY AND VERIFIED WITH C.KISER, RN AT 1449 ON 06.07.24 BY N.THOMPSON    Potassium 2.6 (LL) 3.5 - 5.1 mmol/L    Comment: CRITICAL RESULT CALLED TO, READ BACK BY AND VERIFIED WITH C.KISER, RN AT 1449 ON 06.07.24 BY N.THOMPSON    Chloride 75 (L) 98 - 111 mmol/L   CO2 28 22 - 32 mmol/L   Glucose, Bld 111 (H) 70 - 99 mg/dL     Comment: Glucose reference range applies only to samples taken after fasting for at least 8 hours.   BUN 12 6 - 20 mg/dL   Creatinine, Ser 1.61 0.44 - 1.00 mg/dL   Calcium 9.3 8.9 - 09.6 mg/dL   Total Protein 7.5 6.5 - 8.1 g/dL   Albumin 4.1 3.5 - 5.0 g/dL   AST 34 15 - 41 U/L   ALT 19 0 - 44 U/L   Alkaline Phosphatase 64 38 - 126 U/L   Total Bilirubin 1.0 0.3 - 1.2 mg/dL   GFR, Estimated >04 >54 mL/min    Comment: (NOTE) Calculated using the CKD-EPI Creatinine Equation (2021)    Anion gap 13 5 - 15    Comment: Performed at [facility], [address], [place], [place] [address]  CBC with Differential     Status: Abnormal   Collection Time: 02/09/23  3:13 PM  Result Value Ref Range   WBC 6.0 4.0 - 10.5 K/uL   RBC 3.67 (L) 3.87 - 5.11 MIL/uL   Hemoglobin 11.6 (L) 12.0 - 15.0 g/dL   HCT 91.4 (L) 78.2 - 95.6 %   MCV 88.0 80.0 - 100.0 fL   MCH 31.6 26.0 - 34.0 pg   MCHC 35.9 30.0 - 36.0 g/dL   RDW 21.3 08.6 - 57.8 %   Platelets 286 150 - 400 K/uL   nRBC 0.0 0.0 - 0.2 %   Neutrophils Relative % 53 %   Neutro Abs 3.1 1.7 - 7.7 K/uL   Lymphocytes Relative 32 %   Lymphs Abs 1.9 0.7 - 4.0 K/uL   Monocytes Relative 13 %   Monocytes Absolute 0.8 0.1 - 1.0 K/uL   Eosinophils Relative 1 %   Eosinophils Absolute 0.1 0.0 - 0.5 K/uL   Basophils Relative 0 %   Basophils Absolute 0.0 0.0 - 0.1 K/uL   Immature Granulocytes 1 %   Abs Immature Granulocytes 0.08 (H) 0.00 - 0.07 K/uL    Comment: Performed at [facility], [address], [place], [place] [address]  Comprehensive metabolic panel     Status: Abnormal   Collection Time: 02/09/23  3:13 PM  Result Value Ref Range   Sodium 116 (LL) 135 - 145 mmol/L    Comment: CRITICAL RESULT CALLED TO, READ BACK BY AND VERIFIED WITH C.KISER, RN AT 1633 ON 06.07.24 BY N.THOMPSON    Potassium 2.3 (LL) 3.5 - 5.1 mmol/L    Comment: CRITICAL RESULT CALLED TO, READ BACK BY AND VERIFIED WITH C.KISER, RN AT  1633 ON 06.07.24 BY N.THOMPSON    Chloride 76 (L) 98 - 111 mmol/L   CO2 27 22 -  32 mmol/L   Glucose, Bld 114 (H) 70 - 99 mg/dL    Comment: Glucose reference range applies only to samples taken after fasting for at least 8 hours.   BUN 12 6 - 20 mg/dL   Creatinine, Ser 4.09 0.44 - 1.00 mg/dL   Calcium 8.9 8.9 - 81.1 mg/dL   Total Protein 6.5 6.5 - 8.1 g/dL   Albumin 3.8 3.5 - 5.0 g/dL   AST 26 15 - 41 U/L   ALT 18 0 - 44 U/L   Alkaline Phosphatase 60 38 - 126 U/L   Total Bilirubin 1.0 0.3 - 1.2 mg/dL   GFR, Estimated >91 >47 mL/min    Comment: (NOTE) Calculated using the CKD-EPI Creatinine Equation (2021)    Anion gap 13 5 - 15    Comment: Performed at [facility], [address], [place], [place] [address]  Magnesium     Status: Abnormal   Collection Time: 02/09/23  3:13 PM  Result Value Ref Range   Magnesium 1.5 (L) 1.7 - 2.4 mg/dL    Comment: Performed at [facility], [address] [place], [place] [address]  Urinalysis, Routine w reflex microscopic -Urine, Clean Catch     Status: None   Collection Time: 02/09/23  5:23 PM  Result Value Ref Range   Color, Urine YELLOW YELLOW   APPearance CLEAR CLEAR   Specific Gravity, Urine 1.008 1.005 - 1.030   pH 6.0 5.0 - 8.0   Glucose, UA NEGATIVE NEGATIVE mg/dL   Hgb urine dipstick NEGATIVE NEGATIVE   Bilirubin Urine NEGATIVE NEGATIVE   Ketones, ur NEGATIVE NEGATIVE mg/dL   Protein, ur NEGATIVE NEGATIVE mg/dL   Nitrite NEGATIVE NEGATIVE   Leukocytes,Ua NEGATIVE NEGATIVE    Comment: Performed at [facility], [address], [place], [place] [address]  Sodium, urine, random     Status: None   Collection Time: 02/09/23  5:24 PM  Result Value Ref Range   Sodium, Ur <10 mmol/L    Comment: Performed at [facility], [address] [place], [place] [address]  Osmolality, urine     Status: Abnormal   Collection Time: 02/09/23  5:24 PM  Result Value Ref  Range   Osmolality, Ur 177 (L) 300 - 900 mOsm/kg    Comment: Performed at [facility], [address] [place], [place] [address]  Osmolality     Status: Abnormal   Collection Time: 02/09/23  5:25 PM  Result Value Ref Range   Osmolality 247 (LL) 275 - 295 mOsm/kg    Comment: REPEATED TO VERIFY CRITICAL RESULT CALLED TO, READ BACK BY AND VERIFIED WITH: Called to Ronnald Ramp, EMT @[facility]  1940 02/09/2023 by S.Stanley Performed at [facility], [address] [place], [place] [address]   Sodium     Status: Abnormal   Collection Time: 02/09/23  7:40 PM  Result Value Ref Range   Sodium 117 (LL) 135 - 145 mmol/L    Comment: CRITICAL VALUE NOTED. VALUE IS CONSISTENT WITH PREVIOUSLY REPORTED/CALLED VALUE Performed at [facility], [address] [place], [place] [address]   Potassium     Status: Abnormal   Collection Time: 02/09/23  7:40 PM  Result Value Ref Range   Potassium 2.2 (LL) 3.5 - 5.1 mmol/L    Comment: CRITICAL VALUE NOTED. VALUE IS CONSISTENT WITH PREVIOUSLY REPORTED/CALLED VALUE Performed at [facility], [address], [place], [place] [address]   MRSA  Next Gen by PCR, Nasal     Status: None   Collection Time: 02/10/23  2:48 AM   Specimen: Nasal Mucosa; Nasal Swab  Result Value Ref Range   MRSA by PCR Next Gen NOT DETECTED NOT DETECTED    Comment: (NOTE) The GeneXpert MRSA Assay (FDA approved for NASAL specimens only), is one component of a comprehensive MRSA colonization surveillance program. It is not intended to diagnose MRSA infection nor to guide or monitor treatment for MRSA infections. Test performance is not FDA approved in patients less than 52 years old. Performed at [facility], [address], [place], [place] [address]   CBC     Status: Abnormal   Collection Time: 02/10/23  4:24 AM  Result Value Ref Range   WBC 5.6 4.0 - 10.5 K/uL   RBC 3.77 (L) 3.87 - 5.11 MIL/uL   Hemoglobin  12.1 12.0 - 15.0 g/dL   HCT 19.1 (L) 47.8 - 29.5 %   MCV 90.7 80.0 - 100.0 fL   MCH 32.1 26.0 - 34.0 pg   MCHC 35.4 30.0 - 36.0 g/dL   RDW 62.1 30.8 - 65.7 %   Platelets 300 150 - 400 K/uL   nRBC 0.0 0.0 - 0.2 %    Comment: Performed at [facility], [address] [place], [place] [address]  Comprehensive metabolic panel     Status: Abnormal   Collection Time: 02/10/23  4:24 AM  Result Value Ref Range   Sodium 123 (L) 135 - 145 mmol/L    Comment: DELTA CHECK NOTED   Potassium 2.8 (L) 3.5 - 5.1 mmol/L   Chloride 84 (L) 98 - 111 mmol/L   CO2 28 22 - 32 mmol/L   Glucose, Bld 123 (H) 70 - 99 mg/dL    Comment: Glucose reference range applies only to samples taken after fasting for at least 8 hours.   BUN 13 6 - 20 mg/dL   Creatinine, Ser 2.95 0.44 - 1.00 mg/dL   Calcium 9.1 8.9 - 28.4 mg/dL   Total Protein 6.6 6.5 - 8.1 g/dL   Albumin 3.5 3.5 - 5.0 g/dL   AST 25 15 - 41 U/L   ALT 16 0 - 44 U/L   Alkaline Phosphatase 54 38 - 126 U/L   Total Bilirubin 0.9 0.3 - 1.2 mg/dL   GFR, Estimated >13 >24 mL/min    Comment: (NOTE) Calculated using the CKD-EPI Creatinine Equation (2021)    Anion gap 11 5 - 15    Comment: Performed at [facility], [address] [place], [place] [address]  Magnesium     Status: None   Collection Time: 02/10/23  4:24 AM  Result Value Ref Range   Magnesium 2.3 1.7 - 2.4 mg/dL    Comment: Performed at [facility], [address], [place], [place] [address]  TSH     Status: None   Collection Time: 02/10/23  4:24 AM  Result Value Ref Range   TSH 1.149 0.350 - 4.500 uIU/mL    Comment: Performed by a 3rd Generation assay with a functional sensitivity of <=0.01 uIU/mL. Performed at [facility], [address] [place], [place] [address]    DG Hip Lucienne Capers or Wo Pelvis 2-3 Views Right  Result Date: 02/09/2023 CLINICAL DATA:  Right hip pain shooting down the leg. EXAM: DG HIP (WITH  OR WITHOUT PELVIS) 2-3V RIGHT COMPARISON:  None Available. FINDINGS: There is no evidence of hip  fracture or dislocation. There is no evidence of arthropathy or other focal bone abnormality. IMPRESSION: Negative. Electronically Signed   By: Minerva Fester M.D.   On: 02/09/2023 21:36   MR LUMBAR SPINE WO CONTRAST  Result Date: 02/09/2023 CLINICAL DATA:  Low back pain and right leg pain for 4 days. EXAM: MRI LUMBAR SPINE WITHOUT CONTRAST TECHNIQUE: Multiplanar, multisequence MR imaging of the lumbar spine was performed. No intravenous contrast was administered. COMPARISON:  CT abdomen/pelvis 10/17/2021. FINDINGS: Segmentation: Standard; the lowest formed disc space is designated L5-S1. Alignment:  Normal. Vertebrae: Compression deformity of the L1 vertebral body with up to approximately 30% loss of vertebral body height anteriorly is unchanged since 2023, without marrow edema or bony retropulsion. The other vertebral body heights are preserved. Background marrow signal is normal. There is no suspicious marrow signal abnormality or marrow edema. Conus medullaris and cauda equina: Conus extends to the L1 level. Conus and cauda equina appear normal. Paraspinal and other soft tissues: Unremarkable. Disc levels: The disc heights are overall preserved with mild desiccation at T11-T12 and L3-L4. T12-L1: Mild right worse than left facet arthropathy without significant spinal canal or neural foraminal stenosis. L1-L2: There is a minimal disc bulge and minimal facet arthropathy without significant spinal canal or neural foraminal stenosis. L2-L3: There is a shallow left foraminal/extraforaminal protrusion without significant spinal canal or neural foraminal stenosis. L3-L4: There is a mild disc bulge, mild bilateral facet arthropathy, and prominent dorsal epidural fat resulting in mild spinal canal stenosis and no significant neural foraminal stenosis. L4-L5: There is a mild disc bulge, mild bilateral facet arthropathy, and  prominent dorsal epidural fat resulting in mild left subarticular zone narrowing without evidence of nerve root impingement, and mild-to-moderate right and mild left neural foraminal stenosis L5-S1: No significant spinal canal or neural foraminal stenosis. IMPRESSION: 1. 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 No acute osseous finding. 2. At L3-L4 there is mild spinal canal narrowing, and at L4-L5 there is mild left subarticular zone narrowing without evidence of impingement and mild-to-moderate right and mild left neural foraminal stenosis due to mild disc bulges, mild facet arthropathy, and prominent dorsal epidural fat. No high-grade spinal canal or neural foraminal stenosis at any level. Electronically Signed   By: Lesia Hausen M.D.   On: 02/09/2023 19:02    PMH:   Past Medical History:  Diagnosis Date   Acid reflux    Asthma    Essential hypertension    Fibromyalgia    History of cardiomyopathy    Nonischemic 2011, LVEF 45-50%   History of migraine headaches    OSA (obstructive sleep apnea)     PSH:   Past Surgical History:  Procedure Laterality Date   CESAREAN SECTION     HUMERUS FRACTURE SURGERY Left    LIPOSUCTION     Skin cyst removal     stab phlebectomy Left 02/08/2022   stab phlebectomy 10-20 incisions left leg by Cari Caraway MD   TONSILLECTOMY AND ADENOIDECTOMY     TYMPANOTOMY      Allergies:  Allergies  Allergen Reactions   Valsartan-Hydrochlorothiazide Swelling    Face swelling   Bupropion     Other reaction(s): HA, nausea   Buspirone Hcl     Other reaction(s): irritability   Citalopram Hydrobromide     Other reaction(s): irritability   Gabapentin     Other reaction(s): swelling & dizziness   Iodine-131 Other (See Comments)    other   Iohexol Hives   Venlafaxine  Other reaction(s): Hair Loss    Medications:   Prior to Admission medications   Medication Sig Start Date End Date Taking? Authorizing Provider  ALPRAZolam  Prudy Feeler) 0.25 MG tablet Take up to 2 tablets by mouth every 4 hours prn. Patient taking differently: Take 0.25-0.5 mg by mouth every 4 (four) hours as needed for anxiety. 10/26/14  Yes Hommel, Sean, DO  carvedilol (COREG) 12.5 MG tablet Take 1 tablet (12.5 mg total) by mouth 2 (two) times daily. 10/27/22 10/27/23 Yes Jonelle Sidle, MD  Cholecalciferol (D 1000) 25 MCG (1000 UT) capsule Take 1,000 Units by mouth daily. 06/05/22  Yes [provider]  doxycycline (DORYX) 100 MG EC tablet Take 100 mg by mouth daily.   Yes [provider]  DULoxetine (CYMBALTA) 60 MG capsule Take 60 mg by mouth daily.   Yes [provider]  estradiol (VIVELLE-DOT) 0.05 MG/24HR patch Place 1 patch onto the skin 2 (two) times a week.   Yes [provider]  furosemide (LASIX) 20 MG tablet Take 20 mg by mouth daily as needed for fluid or edema.   Yes [provider]  indomethacin (INDOCIN) 25 MG capsule Take 50 mg by mouth 2 (two) times daily. 12/03/21  Yes [provider]  lansoprazole (PREVACID) 15 MG capsule Take 30 mg by mouth daily. 04/13/20  Yes [provider]  lidocaine (LIDODERM) 5 % Place 1 patch onto the skin daily. Remove & Discard patch within 12 hours or as directed by MD 02/06/23  Yes Blue, Soijett A, PA-C  methocarbamol (ROBAXIN) 500 MG tablet Take 1 tablet (500 mg total) by mouth 2 (two) times daily. 02/06/23  Yes Blue, Soijett A, PA-C  oxyCODONE-acetaminophen (PERCOCET/ROXICET) 5-325 MG tablet Take 1 tablet by mouth every 6 (six) hours as needed for severe pain. 02/06/23  Yes Blue, Soijett A, PA-C  potassium chloride (KLOR-CON) 10 MEQ tablet Take 1 tablet (10 mEq total) by mouth daily. 10/27/22  Yes Jonelle Sidle, MD  predniSONE (DELTASONE) 20 MG tablet Take 2 tablets (40 mg total) by mouth daily for 5 days. 02/06/23 02/11/23 Yes Blue, Soijett A, PA-C  progesterone (PROMETRIUM) 100 MG capsule Take 100 mg by mouth every evening. 01/08/23  Yes [provider]  SUMAtriptan (IMITREX) 100 MG tablet Take 100 mg by mouth every 2 (two) hours as needed for migraine or headache.   Yes [provider]  zolpidem (AMBIEN) 10 MG tablet Take 10 mg by mouth at bedtime as needed for sleep.   Yes [provider]  zonisamide (ZONEGRAN) 100 MG capsule Take 1 capsule (100 mg total) by mouth daily. 10/26/14  Yes Hommel, Sean, DO  amLODipine (NORVASC) 5 MG tablet Take 1 tablet (5 mg total) by mouth daily. Patient not taking: Reported on 02/09/2023 02/16/22 02/11/23  [place] Ouch, Lennart Pall, PA-C  hydrochlorothiazide (HYDRODIURIL) 25 MG tablet Take 1 tablet by mouth daily. Patient not taking: Reported on 02/09/2023 12/11/22   Jonelle Sidle, MD  LORazepam (ATIVAN) 1 MG tablet Take 1 tablet 30 minutes prior to leaving house on day of office surgery.  Bring second tablet with you to office on day of office surgery. Patient not taking: Reported on 02/09/2023 02/02/22   Chuck Hint, MD    Discontinued Meds:   Medications Discontinued During This Encounter  Medication Reason   DULoxetine (CYMBALTA) 30 MG capsule Discontinued by provider   furosemide (LASIX) 10 MG/ML solution Discontinued by provider   diclofenac (VOLTAREN) 75 MG EC tablet Discontinued by  provider    Social History:  reports that she has never smoked. She has never used smokeless tobacco. She reports current alcohol use. She reports that she does not use drugs.  Family History:   Family History  Problem Relation Age of Onset   Emphysema Father        Tobacco use   Peripheral Artery Disease Father    Emphysema Paternal Grandmother        Tobacco use   Rheum arthritis Paternal Grandmother    Diabetes Paternal Grandmother    Hypertension Paternal Grandmother    Fibromyalgia Paternal Grandmother    COPD Paternal Grandmother    Asthma Brother    Lung cancer Paternal Grandfather        Tobacco use   Cancer - Lung Paternal Grandfather    Atrial fibrillation Mother     Aneurysm Maternal Grandmother     Blood pressure (!) 104/39, pulse 73, temperature (!) 97.3 F (36.3 C), temperature source Axillary, resp. rate 12, height 5\' 3"  (1.6 m), weight 78.2 kg, last menstrual period 02/09/2012, SpO2 100 %. General appearance: alert, cooperative, and appears stated age Head: Normocephalic, without obvious abnormality, atraumatic Eyes: negative Neck: no adenopathy, no carotid bruit, no JVD, supple, symmetrical, trachea midline, and thyroid not enlarged, symmetric, no tenderness/mass/nodules Back: symmetric, no curvature. ROM normal. No CVA tenderness. Resp: clear to auscultation bilaterally Cardio: regular rate and rhythm GI: soft, non-tender; bowel sounds normal; no masses,  no organomegaly Extremities: extremities normal, atraumatic, no cyanosis or edema Pulses: 2+ and symmetric Skin: Skin color, texture, turgor normal. No rashes or lesions       Ethelene Hal, MD 02/10/2023, 6:36 AM

## 2023-02-10 NOTE — Progress Notes (Signed)
Physical Therapy Evaluation Patient Details Name: [name] MRN: [id number] DOB: [birth year] Today's Date: 02/10/2023  History of Present Illness  52 y.o. female admitted with right hip pain radiating down leg, was in ED a few days earlier for same; MRI=  Unchanged chronic compression deformity of the L1 vertebral body. L3-L4 there is mild spinal canal narrowing, and at L4-L5 there is mild left subarticular zone narrowing without evidence of impingement and mild-to-moderate right and mild left neural  foraminal stenosis due to mild disc bulges, mild facet arthropathy,  and prominent dorsal epidural fat. No high-grade spinal canal or  neural foraminal stenosis at any level.  PMH: GERD, Asthma, Hypertension, fibromyalgia, OSA, CHF  Clinical Impression  Pt admitted with above diagnosis.  Pt with antalgic gait d/t RLE sciatic pain, no LOB with short distance amb in room.  Will follow in acute setting, pt may benefit from OPPT.  Pt currently has walking PRAFO in room, she may walk in this but would not consider custom AFO at this time as she has full ankle ROM at time of PT eval   Pt currently with functional limitations due to the deficits listed below (see PT Problem List). Pt will benefit from acute skilled PT to increase their independence and safety with mobility to allow discharge.          Recommendations for follow up therapy are one component of a multi-disciplinary discharge planning process, led by the attending physician.  Recommendations may be updated based on patient status, additional functional criteria and insurance authorization.  Follow Up Recommendations       Assistance Recommended at Discharge PRN  Patient can return home with the following  Assistance with cooking/housework;Assist for transportation;Help with stairs or ramp for entrance    Equipment Recommendations Other (comment) (TBA)  Recommendations for Other Services       Functional Status Assessment  Patient has had a recent decline in their functional status and demonstrates the ability to make significant improvements in function in a reasonable and predictable amount of time.     Precautions / Restrictions Precautions Precautions: Other (comment) Required Braces or Orthoses: Other Brace Other Brace: walking PRAFO Restrictions Weight Bearing Restrictions: No      Mobility  Bed Mobility Overal bed mobility: Modified Independent                  Transfers Overall transfer level: Needs assistance Equipment used: None Transfers: Sit to/from Stand Sit to Stand: Min guard           General transfer comment: for safety    Ambulation/Gait Ambulation/Gait assistance: Min guard Gait Distance (Feet): 15 Feet   Gait Pattern/deviations: Step-to pattern, Antalgic, Knee flexed in stance - right       General Gait Details: min/guard to steady, R knee flexed in stance d/t pain. no overt LOB  Stairs            Wheelchair Mobility    Modified Rankin (Stroke Patients Only)       Balance Overall balance assessment: Needs assistance Sitting-balance support: Feet supported, No upper extremity supported Sitting balance-Leahy Scale: Good       Standing balance-Leahy Scale: Fair Standing balance comment: NT to challenges                             Pertinent Vitals/Pain Pain Assessment Pain Assessment: 0-10 Pain Score: 4  Pain Descriptors / Indicators: Grimacing, Guarding Pain Intervention(s):  Limited activity within patient's tolerance, Monitored during session, Premedicated before session, Repositioned    Home Living Family/patient expects to be discharged to:: Private residence Living Arrangements: Spouse/significant other Available Help at Discharge: Family Type of Home: House Home Access: Stairs to enter   [employer] of Steps: 3   Home Layout: Two level;Able to live on main level with bedroom/bathroom Home Equipment:  None      Prior Function Prior Level of Function : Independent/Modified Independent                     Hand Dominance        Extremity/Trunk Assessment   Upper Extremity Assessment Upper Extremity Assessment: Overall WFL for tasks assessed    Lower Extremity Assessment Lower Extremity Assessment: RLE deficits/detail;LLE deficits/detail RLE Deficits / Details: ankle AROM WFL, df ~ 4/5 and pf 5/5; knee and hip grossly WFL       Communication   Communication: No difficulties  Cognition Arousal/Alertness: Awake/alert Behavior During Therapy: WFL for tasks assessed/performed Overall Cognitive Status: Within Functional Limits for tasks assessed                                          General Comments      Exercises     Assessment/Plan    PT Assessment Patient needs continued PT services  PT Problem List Decreased strength;Pain;Decreased balance;Decreased mobility       PT Treatment Interventions DME instruction;Gait training;Functional mobility training;Therapeutic activities;Patient/family education;Therapeutic exercise    PT Goals (Current goals can be found in the Care Plan section)  Acute Rehab PT Goals Patient Stated Goal: less pain PT Goal Formulation: With patient Time For Goal Achievement: 02/24/23 Potential to Achieve Goals: Good    Frequency Min 1X/week     Co-evaluation               AM-PAC PT "6 Clicks" Mobility  Outcome Measure Help needed turning from your back to your side while in a flat bed without using bedrails?: None Help needed moving from lying on your back to sitting on the side of a flat bed without using bedrails?: None Help needed moving to and from a bed to a chair (including a wheelchair)?: A Little Help needed standing up from a chair using your arms (e.g., wheelchair or bedside chair)?: A Little Help needed to walk in hospital room?: A Little Help needed climbing 3-5 steps with a railing? : A  Lot 6 Click Score: 19    End of Session   Activity Tolerance: Patient tolerated treatment well Patient left: in chair;with call bell/phone within reach;with chair alarm set (bed alarm not on, pt reports getting up I'ly to [facility])   PT Visit Diagnosis: Other abnormalities of gait and mobility (R26.89)    Time: 1550-1603 PT Time Calculation (min) (ACUTE ONLY): 13 min   Charges:   PT Evaluation $PT Eval Low Complexity: 1 Low          Deunte Bledsoe, PT  Acute Rehab Dept [facility]) [phone]  02/10/2023   [facility] 02/10/2023, 4:37 PM

## 2023-02-11 DIAGNOSIS — E871 Hypo-osmolality and hyponatremia: Secondary | ICD-10-CM | POA: Diagnosis not present

## 2023-02-11 LAB — RENAL FUNCTION PANEL
Albumin: 3.2 g/dL — ABNORMAL LOW (ref 3.5–5.0)
Albumin: 3.2 g/dL — ABNORMAL LOW (ref 3.5–5.0)
Anion gap: 8 (ref 5–15)
Anion gap: 9 (ref 5–15)
BUN: 12 mg/dL (ref 6–20)
BUN: 11 mg/dL (ref 6–20)
CO2: 26 mmol/L (ref 22–32)
CO2: 22 mmol/L (ref 22–32)
Calcium: 8.9 mg/dL (ref 8.9–10.3)
Calcium: 8.9 mg/dL (ref 8.9–10.3)
Chloride: 97 mmol/L — ABNORMAL LOW (ref 98–111)
Chloride: 101 mmol/L (ref 98–111)
Creatinine, Ser: 0.58 mg/dL (ref 0.44–1.00)
Creatinine, Ser: 0.66 mg/dL (ref 0.44–1.00)
GFR, Estimated: >60 mL/min (ref >60)
GFR, Estimated: >60 mL/min (ref >60)
Glucose, Bld: 109 mg/dL — ABNORMAL HIGH (ref 70–99)
Glucose, Bld: 165 mg/dL — ABNORMAL HIGH (ref 70–99)
Phosphorus: 2.5 mg/dL (ref 2.5–4.6)
Phosphorus: 2.1 mg/dL — ABNORMAL LOW (ref 2.5–4.6)
Potassium: 3.6 mmol/L (ref 3.5–5.1)
Potassium: 4.7 mmol/L (ref 3.5–5.1)
Sodium: 131 mmol/L — ABNORMAL LOW (ref 135–145)
Sodium: 132 mmol/L — ABNORMAL LOW (ref 135–145)

## 2023-02-11 LAB — BASIC METABOLIC PANEL
Anion gap: 8 (ref 5–15)
BUN: 11 mg/dL (ref 6–20)
CO2: 26 mmol/L (ref 22–32)
Calcium: 8.9 mg/dL (ref 8.9–10.3)
Chloride: 97 mmol/L — ABNORMAL LOW (ref 98–111)
Creatinine, Ser: 0.56 mg/dL (ref 0.44–1.00)
GFR, Estimated: >60 mL/min (ref >60)
Glucose, Bld: 106 mg/dL — ABNORMAL HIGH (ref 70–99)
Potassium: 3.6 mmol/L (ref 3.5–5.1)
Sodium: 131 mmol/L — ABNORMAL LOW (ref 135–145)

## 2023-02-11 LAB — GLUCOSE, CAPILLARY: Glucose-Capillary: 87 mg/dL (ref 70–99)

## 2023-02-11 MED ORDER — DICLOFENAC EPOLAMINE 1.3 % EX PTCH
1.0000 | MEDICATED_PATCH | Freq: Two times a day (BID) | CUTANEOUS | Status: DC
  Administered 2023-02-11 – 2023-02-12 (×3): 1 via TRANSDERMAL
  Filled 2023-02-11 (×4): qty 1

## 2023-02-11 MED ORDER — ZOLPIDEM TARTRATE 5 MG PO TABS
5.0000 mg | ORAL_TABLET | Freq: Once | ORAL | Status: AC
  Administered 2023-02-11: 5 mg via ORAL
  Filled 2023-02-11: qty 1

## 2023-02-11 MED ORDER — HYDROMORPHONE HCL 1 MG/ML IJ SOLN
0.5000 mg | Freq: Once | INTRAMUSCULAR | Status: AC | PRN
  Administered 2023-02-11: 0.5 mg via INTRAVENOUS
  Filled 2023-02-11: qty 1

## 2023-02-11 MED ORDER — OXYCODONE HCL 5 MG PO TABS
5.0000 mg | ORAL_TABLET | Freq: Once | ORAL | Status: AC | PRN
  Administered 2023-02-11: 5 mg via ORAL

## 2023-02-11 MED ORDER — KETOROLAC TROMETHAMINE 15 MG/ML IJ SOLN
15.0000 mg | Freq: Once | INTRAMUSCULAR | Status: AC
  Administered 2023-02-11: 15 mg via INTRAVENOUS
  Filled 2023-02-11 (×2): qty 1

## 2023-02-11 MED ORDER — HYDROMORPHONE HCL 1 MG/ML IJ SOLN
0.5000 mg | INTRAMUSCULAR | Status: AC | PRN
  Administered 2023-02-11: 0.5 mg via INTRAVENOUS
  Filled 2023-02-11: qty 1

## 2023-02-11 MED ORDER — KETOROLAC TROMETHAMINE 15 MG/ML IJ SOLN
INTRAMUSCULAR | Status: AC
  Filled 2023-02-11: qty 1

## 2023-02-11 MED ORDER — AMLODIPINE BESYLATE 5 MG PO TABS
5.0000 mg | ORAL_TABLET | Freq: Every day | ORAL | Status: DC
  Administered 2023-02-12: 5 mg via ORAL
  Filled 2023-02-11: qty 1

## 2023-02-11 MED ORDER — DIAZEPAM 2 MG PO TABS
2.0000 mg | ORAL_TABLET | Freq: Three times a day (TID) | ORAL | Status: DC
  Administered 2023-02-11 – 2023-02-12 (×4): 2 mg via ORAL
  Filled 2023-02-11 (×4): qty 1

## 2023-02-11 NOTE — Progress Notes (Addendum)
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 Nada Maclachlan Thoracolumbar joint pain?   followed by Dr. Tinnie Gens neurology Migraines-- previously followed with Dr. Lorenda Ishihara [facility] clinic fprevious trochanteric bursa injections etc. for IT band syndrome ?  Raynaud's IBS gerd Varicose veins/telangiectasia followed by Dr. Randie Heinz of vascular  Seen in emergency room 02/06/2023 Rx for sciatica Robaxin lidocaine patch prednisone-return with increasing pain-IV steroids seem to help  On evaluation profound hyponatremia Sodium 116 potassium 2.6, UOsm 247 Usod less than 10 Hip x-ray negative MRI lumbar = L3-L4, L4-L5 mild narrowing no impingement mild foraminal stenosis Rx oxycodone Toradol Tylenol diazepam Dilaudid Admitted for severe hyponatremia to SDU-renal consulted  Dr. Jake Samples was consulted on admission and did not recommend any invasive or other measures for treatment  Hospital-Problem based course  Hypoosmolar hyponatremia 2/2 thiazide diuretic,?  SSRI Hold 3% saline Hold home Lasix Would not use thiazide in the future Fluids as per nephro--await Urine studies  Hypomagnesemia hypokalemia- Fluids as above-Kdur now 40 daily Magnesium normalized from admit  Sciatica, prior trochanteric bursa injections currently Cymbalta being used in the outpatient Mild foot drop on right side--neurosurgery aware and does not require their services at this time Pain severe --Toradol x 1 okayed by Nephro continue steroid burst prednisone X 2 days  ---Robaxin 500 twice daily ---continue valium 2 mg every 8  and schedule it ---continue Lyrica 75 and titrate upwards if beneficial ---resumed indocin 75 PT has  evalauted and will need to re-eval once pain better controlled  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  Raynaud's ?  IBS/GERD Varicose veins--- all of the above are stable and require outpatient routine follow-up   DVT prophylaxis: Heparin Code Status: Full Family Communication: Husband at bedside Disposition:  Status is: Inpatient Remains inpatient appropriate because:   Requires improvement sodium Requires PT OT    Subjective:  Severe radiating pain down RLE--cann do some SLR--antalgic with internal eand external rotaion @ Hip with Rad--->back of Calf/thigh tearful  Objective: Vitals:   02/11/23 0700 02/11/23 0800 02/11/23 0900 02/11/23 1019  BP: (!) 97/52 (!) 97/42 112/74 (!) 167/71  Pulse: 76 79 73   Resp:    17  Temp:  97.8 F (36.6 C)    TempSrc:  Oral    SpO2: 99% 95% 98%   Weight:      Height:        Intake/Output Summary (Last 24 hours) at 02/11/2023 1118 Last data filed at 02/11/2023 0600 Gross per 24 hour  Intake 1167.88 ml  Output --  Net 1167.88 ml    Filed Weights   02/10/23 0010 02/10/23 0500 02/11/23 0500  Weight: 78.2 kg 78.2 kg 78.4 kg    Examination:  EOMI NCAT tearful Chest clear no wheeze Abdomen soft S1-S2 sinus rhythm with NSR PVCs  No lower extremity edema-tattoo on left foot    Data Reviewed: personally reviewed  CBC    Component Value Date/Time   WBC 5.6 02/10/2023 0424   RBC 3.77 (L) 02/10/2023 0424   HGB 12.1 02/10/2023 0424   HCT 34.2 (L) 02/10/2023 0424   PLT 300 02/10/2023 0424   MCV 90.7 02/10/2023 0424   MCH 32.1 02/10/2023 0424   MCHC 35.4  02/10/2023 0424   RDW 12.2 02/10/2023 0424   LYMPHSABS 1.9 02/09/2023 1513   MONOABS 0.8 02/09/2023 1513   EOSABS 0.1 02/09/2023 1513   BASOSABS 0.0 02/09/2023 1513      Latest Ref Rng & Units 02/11/2023    5:59 AM 02/10/2023    4:55 PM 02/10/2023   12:32 PM  CMP  Glucose 70 - 99 mg/dL 70 - 99 mg/dL 161    096  045    BUN 6 - 20 mg/dL 6 - 20 mg/dL 12    11  15     Creatinine 0.44 - 1.00 mg/dL 4.09 - 8.11 mg/dL 9.14    7.82  9.56    Sodium 135 - 145 mmol/L 135 - 145 mmol/L 131    131  127  127   Potassium 3.5 - 5.1 mmol/L 3.5 - 5.1 mmol/L 3.6    3.6  3.4    Chloride 98 - 111 mmol/L 98 - 111 mmol/L 97    97  90    CO2 22 - 32 mmol/L 22 - 32 mmol/L 26    26  28     Calcium 8.9 - 10.3 mg/dL 8.9 - 21.3 mg/dL 8.9    8.9  9.4       Radiology Studies: DG Hip Unilat W or Wo Pelvis 2-3 Views Right  Result Date: 02/09/2023 CLINICAL DATA:  Right hip pain shooting down the leg. EXAM: DG HIP (WITH OR WITHOUT PELVIS) 2-3V RIGHT COMPARISON:  None Available. FINDINGS: There is no evidence of hip fracture or dislocation. There is no evidence of arthropathy or other focal bone abnormality. IMPRESSION: Negative. Electronically Signed   By: Minerva Fester M.D.   On: 02/09/2023 21:36   MR LUMBAR SPINE WO CONTRAST  Result Date: 02/09/2023 CLINICAL DATA:  Low back pain and right leg pain for 4 days. EXAM: MRI LUMBAR SPINE WITHOUT CONTRAST TECHNIQUE: Multiplanar, multisequence MR imaging of the lumbar spine was performed. No intravenous contrast was administered. COMPARISON:  CT abdomen/pelvis 10/17/2021. FINDINGS: Segmentation: Standard; the lowest formed disc space is designated L5-S1. Alignment:  Normal. Vertebrae: Compression deformity of the L1 vertebral body with up to approximately 30% loss of vertebral body height anteriorly is unchanged since 2023, without marrow edema or bony retropulsion. The other vertebral body heights are preserved. Background marrow signal is normal. There is no  suspicious marrow signal abnormality or marrow edema. Conus medullaris and cauda equina: Conus extends to the L1 level. Conus and cauda equina appear normal. Paraspinal and other soft tissues: Unremarkable. Disc levels: The disc heights are overall preserved with mild desiccation at T11-T12 and L3-L4. T12-L1: Mild right worse than left facet arthropathy without significant spinal canal or neural foraminal stenosis. L1-L2: There is a minimal disc bulge and minimal facet arthropathy without significant spinal canal or neural foraminal stenosis. L2-L3: There is a shallow left foraminal/extraforaminal protrusion without significant spinal canal or neural foraminal stenosis. L3-L4: There is a mild disc bulge, mild bilateral facet arthropathy, and prominent dorsal epidural fat resulting in mild spinal canal stenosis and no significant neural foraminal stenosis. L4-L5: There is a mild disc bulge, mild bilateral facet arthropathy, and prominent dorsal epidural fat  resulting in mild left subarticular zone narrowing without evidence of nerve root impingement, and mild-to-moderate right and mild left neural foraminal stenosis L5-S1: No significant spinal canal or neural foraminal stenosis. IMPRESSION: 1. 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 No acute osseous finding. 2. At L3-L4 there is mild spinal canal narrowing, and at L4-L5 there is mild left subarticular zone narrowing without evidence of impingement and mild-to-moderate right and mild left neural foraminal stenosis due to mild disc bulges, mild facet arthropathy, and prominent dorsal epidural fat. No high-grade spinal canal or neural foraminal stenosis at any level. Electronically Signed   By: Lesia Hausen M.D.   On: 02/09/2023 19:02     Scheduled Meds:  amLODipine  5 mg Oral Daily   carvedilol  12.5 mg Oral BID   Chlorhexidine Gluconate Cloth  6 each Topical Q0600   cholecalciferol  1,000 Units Oral Daily   diazepam  2 mg Oral Q8H    diclofenac  1 patch Transdermal BID   DULoxetine  60 mg Oral Daily   heparin  5,000 Units Subcutaneous Q8H   indomethacin  75 mg Oral Q breakfast   ketorolac  15 mg Intravenous Once   lidocaine  1 patch Transdermal Q24H   methocarbamol  500 mg Oral BID   pantoprazole  40 mg Oral Daily   potassium chloride  40 mEq Oral Daily   predniSONE  40 mg Oral Q breakfast   pregabalin  75 mg Oral Daily   zonisamide  100 mg Oral Daily   Continuous Infusions:  sodium chloride Stopped (02/10/23 1613)   sodium chloride 85 mL/hr at 02/11/23 0600     LOS: 2 days   Time spent: 77  Rhetta Mura, MD [facility] To contact the attending provider between 7A-7P or the covering provider during after hours 7P-7A, please log into the web site [email or web address] and access using universal [place] password for that web site. If you do not have the password, please call the hospital operator.  02/11/2023, 11:18 AM

## 2023-02-11 NOTE — Progress Notes (Signed)
[facility] Progress Note  Subjective: pt having sig sciatica problems. Na+ is up to 131.   Vitals:   02/11/23 0800 02/11/23 0900 02/11/23 1019 02/11/23 1100  BP: (!) 97/42 112/74 (!) 167/71 (!) 146/61  Pulse: 79 73  94  Resp:   17 18  Temp: 97.8 F (36.6 C)     TempSrc: Oral     SpO2: 95% 98%  92%  Weight:      Height:        Exam: General appearance: alert, cooperative, and appears stated age Back: symmetric, no curvature.  Resp: clear to auscultation bilaterally Cardio: regular rate and rhythm GI: soft, non-tender; bowel sounds normal; no masses,  no organomegaly Extremities: extremities normal, atraumatic, no cyanosis or edema Puffy lower legs above the ankles but this is adipose not edema Pulses: 2+ and symmetric Skin: Skin color, texture, turgor normal. No rashes or lesions     Home meds - estradiol, indocin, percocet, ambein, ativan, xanax, coreg 12.5 bid, cymbalta, lasix 20mg  qd prn, prevacid, klorcon, prednisone x 5 days, progesterone, imitrex, norvasc 5 mg qd, hctz 25 qd (now dc'd), prns/ vits/ supps     UOsm 177, UNa < 10   UA negative   Cortisol - 3.7 (6.7- 22.6)   TSH - 0.746     Assessment/ Plan: Hyponatremia - pt was taking HCTZ, recently dc'd, but has taken Goody's powder since d/c from ED 3 days ago. Of note the urine osmolality is 177 which is low, and low urine Na at <10.  The patient is not hypervolemic by exam or history and has not been able to keep anything down PO with nausea + vomiting. Most likely this is hypovolemic hyponatremia, w/ also possibly an SIADH component from the nausea + pain +/- Goody's powders. Pt has responded well to isotonic IVF"s which confirms the diagnosis of hypovolemic hyponatremia. Her cortisol is a bit low (3.7), which may or may not be contributing. She is reluctant to stop the nsaid's due to her pain issues. Will dc her IVF"s, no further suggestions. Will sign off.  HFpEF - appears to be compensated HTN -  resume home regimen Sciatica - major issue, she will not be able to dc nsaids. Hopefully avoiding HCTZ will help prevent recurrence of #1 above.      Vinson Moselle MD [facility] 02/11/2023, 12:55 PM  Recent Labs  Lab 02/09/23 1513 02/09/23 1940 02/10/23 0424 02/10/23 1655 02/11/23 0559  HGB 11.6*  --  12.1  --   --   ALBUMIN 3.8  --  3.5 3.7 3.2*  CALCIUM 8.9  --  9.1 9.4 8.9  8.9  PHOS  --   --   --  2.1* 2.5  CREATININE 0.69  --  0.58 0.56 0.58  0.56  K 2.3*   < > 2.8* 3.4* 3.6  3.6   < > = values in this interval not displayed.   No results for input(s): "IRON", "TIBC", "FERRITIN" in the last 168 hours. Inpatient medications:  amLODipine  5 mg Oral Daily   carvedilol  12.5 mg Oral BID   Chlorhexidine Gluconate Cloth  6 each Topical Q0600   cholecalciferol  1,000 Units Oral Daily   diazepam  2 mg Oral Q8H   diclofenac  1 patch Transdermal BID   DULoxetine  60 mg Oral Daily   heparin  5,000 Units Subcutaneous Q8H   indomethacin  75 mg Oral Q breakfast   ketorolac  15 mg Intravenous Once  lidocaine  1 patch Transdermal Q24H   methocarbamol  500 mg Oral BID   pantoprazole  40 mg Oral Daily   potassium chloride  40 mEq Oral Daily   predniSONE  40 mg Oral Q breakfast   pregabalin  75 mg Oral Daily   zonisamide  100 mg Oral Daily    sodium chloride Stopped (02/10/23 1613)   sodium chloride 85 mL/hr at 02/11/23 1100   sodium chloride, albuterol, ALPRAZolam, alum & mag hydroxide-simeth, hydrALAZINE, ondansetron **OR** ondansetron (ZOFRAN) IV, mouth rinse, oxyCODONE, SUMAtriptan

## 2023-02-11 NOTE — Plan of Care (Signed)

## 2023-02-12 ENCOUNTER — Other Ambulatory Visit (HOSPITAL_COMMUNITY): Payer: Self-pay

## 2023-02-12 ENCOUNTER — Encounter: Payer: Self-pay | Admitting: Family Medicine

## 2023-02-12 DIAGNOSIS — E871 Hypo-osmolality and hyponatremia: Secondary | ICD-10-CM | POA: Diagnosis not present

## 2023-02-12 LAB — RENAL FUNCTION PANEL
Albumin: 3 g/dL — ABNORMAL LOW (ref 3.5–5.0)
Anion gap: 9 (ref 5–15)
BUN: 12 mg/dL (ref 6–20)
CO2: 26 mmol/L (ref 22–32)
Calcium: 8.8 mg/dL — ABNORMAL LOW (ref 8.9–10.3)
Chloride: 98 mmol/L (ref 98–111)
Creatinine, Ser: 0.57 mg/dL (ref 0.44–1.00)
GFR, Estimated: >60 mL/min (ref >60)
Glucose, Bld: 100 mg/dL — ABNORMAL HIGH (ref 70–99)
Phosphorus: 2.2 mg/dL — ABNORMAL LOW (ref 2.5–4.6)
Potassium: 4.7 mmol/L (ref 3.5–5.1)
Sodium: 133 mmol/L — ABNORMAL LOW (ref 135–145)

## 2023-02-12 MED ORDER — DIAZEPAM 2 MG PO TABS
2.0000 mg | ORAL_TABLET | Freq: Three times a day (TID) | ORAL | 0 refills | Status: AC
  Filled 2023-02-12: qty 50, 17d supply, fill #0

## 2023-02-12 MED ORDER — PREGABALIN 75 MG PO CAPS
75.0000 mg | ORAL_CAPSULE | Freq: Every day | ORAL | 0 refills | Status: AC
  Filled 2023-02-12: qty 30, 30d supply, fill #0

## 2023-02-12 MED ORDER — INDOMETHACIN 25 MG PO CAPS
50.0000 mg | ORAL_CAPSULE | Freq: Two times a day (BID) | ORAL | Status: DC
  Filled 2023-02-12: qty 2

## 2023-02-12 MED ORDER — DICLOFENAC SODIUM 1.6 % EX GEL
1.0000 | Freq: Three times a day (TID) | CUTANEOUS | 3 refills | Status: AC
  Filled 2023-02-12: qty 2.5, fill #0

## 2023-02-12 MED ORDER — OXYCODONE HCL 5 MG PO TABS
5.0000 mg | ORAL_TABLET | ORAL | 0 refills | Status: AC | PRN
  Filled 2023-02-12: qty 30, 5d supply, fill #0

## 2023-02-12 NOTE — Progress Notes (Signed)
Physical Therapy Treatment Patient Details Name: [name] MRN: [id number] DOB: [birth year] Today's Date: 02/12/2023   History of Present Illness 52 y.o. female admitted with right hip pain radiating down leg, was in ED a few days earlier for same; MRI=  Unchanged chronic compression deformity of the L1 vertebral body. L3-L4 there is mild spinal canal narrowing, and at L4-L5 there is mild left subarticular zone narrowing without evidence of impingement and mild-to-moderate right and mild left neural  foraminal stenosis due to mild disc bulges, mild facet arthropathy,  and prominent dorsal epidural fat. No high-grade spinal canal or  neural foraminal stenosis at any level.  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  L ankle 5/5.  R ankle PF/DF 4+/5  Trial amb without PFAFO which pt c/o "too heavy", "too bulky" and "too loose fitting".  Pt afraid it will cause her to trip.  Assisted with amb in hallway went well.  General Gait Details: supervision tolerated an increased distance and no need for any AD.  "Foot drop" appears less pronounced.  pt does exhibit decreased stance time R LE which appears even more pronounced with increased distance.  Pt holding to wall rail at end of session.  Pain has improved with medication management.  Returned to room and demonstarted/instructed on some upright static standing strengthening TE's of mini squats, Heel raises, side to side lunges.   One leg stance to engage ankle stability.  Also instructed on chair sits were repeat up/down seated chair.  Educated on B UE's TE's of counter pushups.  Educated on "strengthening" TE''s .   LPT has rec OP PT             Recommendations for follow up therapy are one component of a multi-disciplinary discharge planning process, led by the attending physician.  Recommendations may be updated based on  patient status, additional functional criteria and insurance authorization.  Follow Up Recommendations       Assistance Recommended at Discharge PRN  Patient can return home with the following Assistance with cooking/housework;Assist for transportation;Help with stairs or ramp for entrance   Equipment Recommendations  None recommended by PT    Recommendations for Other Services       Precautions / Restrictions Precautions Precautions: Fall Other Brace: walking PRAFO Restrictions Weight Bearing Restrictions: No Other Position/Activity Restrictions: WBAT     Mobility  Bed Mobility Overal bed mobility: Modified Independent             General bed mobility comments: self able    Transfers Overall transfer level: Needs assistance Equipment used: None Transfers: Sit to/from Stand Sit to Stand: Supervision           General transfer comment: good use of hands to steady self and good safety cognition    Ambulation/Gait Ambulation/Gait assistance: Supervision Gait Distance (Feet): 450 Feet Assistive device: None Gait Pattern/deviations: Step-through pattern       General Gait Details: supervision tolerated an increased distance and no need for any AD.  "Foot drop" appears less pronounced.  pt does exhibit decreased stance time R LE which appears even more pronounced with increased distance.  Pt holding to wall rail at end of session.  Pain has improved with medication management.   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        Film/video editor  Comments        Pertinent Vitals/Pain Pain Assessment Pain Assessment: Faces Faces Pain Scale: Hurts a little bit Pain Location: R posterior buttock "imroved" Pain Descriptors / Indicators: Discomfort Pain Intervention(s): Monitored during session    Home Living                          Prior Function            PT Goals (current goals can now be found in the care plan section) Progress towards PT goals: Progressing toward goals    Frequency    Min 1X/week      PT Plan Current plan remains appropriate    Co-evaluation              AM-PAC PT "6 Clicks" Mobility   Outcome Measure  Help needed turning from your back to your side while in a flat bed without using bedrails?: None Help needed moving from lying on your back to sitting on the side of a flat bed without using bedrails?: None Help needed moving to and from a bed to a chair (including a wheelchair)?: None Help needed standing up from a chair using your arms (e.g., wheelchair or bedside chair)?: None Help needed to walk in hospital room?: None Help needed climbing 3-5 steps with a railing? : A Little 6 Click Score: 23    End of Session Equipment Utilized During Treatment: Gait belt Activity Tolerance: Patient tolerated treatment well Patient left: in bed;with call bell/phone within reach Nurse Communication: Mobility status PT Visit Diagnosis: Other abnormalities of gait and mobility (R26.89)     Time: 1352-1420 PT Time Calculation (min) (ACUTE ONLY): 28 min  Charges:  $Gait Training: 8-22 mins $Therapeutic Exercise: 8-22 mins                     Felecia Shelling  PTA Acute  Rehabilitation Services Office M-F          [phone]

## 2023-02-12 NOTE — TOC CM/SW Note (Signed)
Transition of Care [facility]) - Inpatient Brief Assessment   Patient Details  Name: [name] MRN: [id number] Date of Birth: 09/06/1970  Transition of Care [facility]) CM/SW Contact:    Otelia Santee, LCSW Phone Number: 02/12/2023, 3:39 PM   Clinical Narrative: Chart reviewed. No TOC needs identified.    Transition of Care Asessment: Insurance and Status: Insurance coverage has been reviewed Patient has primary care physician: Yes Home environment has been reviewed: Single family home w/ spouse Prior level of function:: Independent Prior/Current Home Services: No current home services Social Determinants of Health Reivew: SDOH reviewed no interventions necessary Readmission risk has been reviewed: Yes Transition of care needs: no transition of care needs at this time

## 2023-02-12 NOTE — Discharge Summary (Signed)
Physician Discharge Summary  [name] BJY:[id number] DOB: 1971/06/25 DOA: 02/09/2023  PCP: Farris Has, MD  Admit date: 02/09/2023 Discharge date: 02/12/2023  Time spent: 40 minutes  Recommendations for Outpatient Follow-up:  Meds called into [facility] pharmacy Work note given Repeat labs in about 1 week including Chem-12 CBC  Discharge Diagnoses:  MAIN problem for hospitalization   Volume depletion Hypovolemic hyponatremia Migraines Trochanteric bursitis + IT band syndrome Varicose veins  Please see below for itemized issues addressed in HOpsital- refer to other progress notes for clarity if needed  Discharge Condition: Improved  Diet recommendation: Heart healthy  Filed Weights   02/10/23 0500 02/11/23 0500 02/12/23 0500  Weight: 78.2 kg 78.4 kg 78.3 kg    History of present illness:  52 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 Nada Maclachlan Thoracolumbar joint pain?   followed by Dr. Tinnie Gens neurology Migraines-- previously followed with Dr. Lorenda Ishihara [facility] clinic fprevious trochanteric bursa injections etc. for IT band syndrome ?  Raynaud's IBS gerd Varicose veins/telangiectasia followed by Dr. Randie Heinz of vascular   Seen in emergency room 02/06/2023 Rx for sciatica Robaxin lidocaine patch prednisone-return with increasing pain-IV steroids seem to help   On evaluation profound hyponatremia Sodium 116 potassium 2.6, UOsm 247 Usod less than 10 Hip x-ray negative MRI lumbar = L3-L4, L4-L5 mild narrowing no impingement mild foraminal stenosis Rx oxycodone Toradol Tylenol diazepam Dilaudid Admitted for severe hyponatremia to SDU-renal consulted   Dr. Jake Samples was consulted on admission and did not recommend any invasive or other measures for treatment   Hospital-Problem based course    Hypoosmolar hyponatremia 2/2 thiazide diuretic,?  SSRI-sodium 117 on admission never received 3% saline was treated with NS Would not discharge on home Lasix or thiazide marked as allergy thiazide in the future   Hypomagnesemia hypokalemia- Patient is seen was improved to the point that she did not need her usual home dose replacement and she can have labs in a week and discuss with her PCP   Sciatica, prior trochanteric bursa injections currently Cymbalta being used in the outpatient Mild foot drop on right side--neurosurgery aware and does not require their services at this time Prednisone stopped 6/10 ---Robaxin 500 twice daily ---continue valium 2 mg every 8  and schedule it --- cont lyrica 75 --- change indomethacin to 50 twice daily Per therapy eval 6/10 ambulated 500 feet-did not require PR AFO and was able to self manage and tolerate activity  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  Raynaud's ?  IBS/GERD Varicose veins--- all of the above are stable    Discharge Exam: Vitals:   02/12/23 0506 02/12/23 1336  BP: 133/66 (!) 176/83  Pulse: 71 76  Resp: 16 16  Temp: 98.1 F (36.7 C) 97.9 F (36.6 C)  SpO2: 100% 100%    Subj on day of d/c   Awake coherent S1 S2 no m/r/g cta b no added sound Pain much better Abd soft  Neuro intact moving LE's well  Discharge Instructions   Discharge Instructions     Diet - low sodium heart healthy   Complete by: As directed    Discharge instructions   Complete by: As directed    Please make sure that you pick up your medications from the [facility]  outpatient pharmacy-I do think that your lumbago from the sciatica will improve slowly but surely-there is no really good evidence for "back schools" or other modalities of therapy but I would definitely recommend taking the medication and may be consideration of referral to outpatient physical therapy may help you with posture and alignment once you feel stronger It would be a good idea to use the diclofenac cream for topical pain control You were admitted mainly because your sodium was very low so when you go home make sure that if you are going to only be drinking liquids make sure that you alternate free water with solute containing liquids such as Gatorade/boost or Ensure Please follow-up with your physician in about a week I will give you a week out from work and a work letter   Increase activity slowly   Complete by: As directed       Allergies as of 02/12/2023       Reactions   Thiazide-type Diuretics Other (See Comments)   Severe hyponatremia (Na 116) in June 2024, avoid all thiazide type diuretics   Valsartan-hydrochlorothiazide Swelling   Face swelling   Bupropion    Other reaction(s): HA, nausea   Buspirone Hcl    Other reaction(s): irritability   Citalopram Hydrobromide    Other reaction(s): irritability   Gabapentin    Other reaction(s): swelling & dizziness   Iodine-131 Other (See Comments)   other   Iohexol Hives   Venlafaxine    Other reaction(s): Hair Loss        Medication List     STOP taking these medications    ALPRAZolam 0.25 MG tablet Commonly known as: XANAX   amLODipine 5 MG tablet Commonly known as: NORVASC   doxycycline 100 MG EC tablet Commonly known as: DORYX   furosemide 20 MG tablet Commonly known as: LASIX   hydrochlorothiazide 25 MG tablet Commonly known as: HYDRODIURIL   LORazepam 1 MG tablet Commonly known as: ATIVAN   oxyCODONE-acetaminophen 5-325 MG tablet Commonly known as: PERCOCET/ROXICET   potassium chloride 10 MEQ  tablet Commonly known as: KLOR-CON   predniSONE 20 MG tablet Commonly known as: DELTASONE       TAKE these medications    carvedilol 12.5 MG tablet Commonly known as: COREG Take 1 tablet (12.5 mg total) by mouth 2 (two) times daily.   D 1000 25 MCG (1000 UT) capsule Generic drug: Cholecalciferol Take 1,000 Units by mouth daily.   diazepam 2 MG tablet Commonly known as: VALIUM Take 1 tablet (2 mg total) by mouth every 8 (eight) hours.   Diclofenac Sodium 1.6 % Gel Apply 1 Application topically 3 (three) times daily for 20 days.   DULoxetine 60 MG capsule Commonly known as: CYMBALTA Take 60 mg by mouth daily.   estradiol 0.05 MG/24HR patch Commonly known as: VIVELLE-DOT Place 1 patch onto the skin 2 (two) times a week.   indomethacin 25 MG capsule Commonly known as: INDOCIN Take 50 mg by mouth 2 (two) times daily.   lansoprazole 15 MG capsule Commonly known as: PREVACID Take 30 mg by mouth daily.   lidocaine 5 % Commonly known as: Lidoderm Place 1 patch onto the skin daily. Remove & Discard  patch within 12 hours or as directed by MD   methocarbamol 500 MG tablet Commonly known as: ROBAXIN Take 1 tablet (500 mg total) by mouth 2 (two) times daily.   oxyCODONE 5 MG immediate release tablet Commonly known as: Oxy IR/ROXICODONE Take 1 tablet (5 mg total) by mouth every 4 (four) hours as needed for moderate pain.   pregabalin 75 MG capsule Commonly known as: LYRICA Take 1 capsule (75 mg total) by mouth daily. Start taking on: February 13, 2023   progesterone 100 MG capsule Commonly known as: PROMETRIUM Take 100 mg by mouth every evening.   SUMAtriptan 100 MG tablet Commonly known as: IMITREX Take 100 mg by mouth every 2 (two) hours as needed for migraine or headache.   zolpidem 10 MG tablet Commonly known as: AMBIEN Take 10 mg by mouth at bedtime as needed for sleep.   zonisamide 100 MG capsule Commonly known as: ZONEGRAN Take 1 capsule (100 mg total) by  mouth daily.       Allergies  Allergen Reactions   Thiazide-Type Diuretics Other (See Comments)    Severe hyponatremia (Na 116) in June 2024, avoid all thiazide type diuretics   Valsartan-Hydrochlorothiazide Swelling    Face swelling   Bupropion     Other reaction(s): HA, nausea   Buspirone Hcl     Other reaction(s): irritability   Citalopram Hydrobromide     Other reaction(s): irritability   Gabapentin     Other reaction(s): swelling & dizziness   Iodine-131 Other (See Comments)    other   Iohexol Hives   Venlafaxine     Other reaction(s): Hair Loss      The results of significant diagnostics from this hospitalization (including imaging, microbiology, ancillary and laboratory) are listed below for reference.    Significant Diagnostic Studies: DG Hip Unilat W or Wo Pelvis 2-3 Views Right  Result Date: 02/09/2023 CLINICAL DATA:  Right hip pain shooting down the leg. EXAM: DG HIP (WITH OR WITHOUT PELVIS) 2-3V RIGHT COMPARISON:  None Available. FINDINGS: There is no evidence of hip fracture or dislocation. There is no evidence of arthropathy or other focal bone abnormality. IMPRESSION: Negative. Electronically Signed   By: Minerva Fester M.D.   On: 02/09/2023 21:36   MR LUMBAR SPINE WO CONTRAST  Result Date: 02/09/2023 CLINICAL DATA:  Low back pain and right leg pain for 4 days. EXAM: MRI LUMBAR SPINE WITHOUT CONTRAST TECHNIQUE: Multiplanar, multisequence MR imaging of the lumbar spine was performed. No intravenous contrast was administered. COMPARISON:  CT abdomen/pelvis 10/17/2021. FINDINGS: Segmentation: Standard; the lowest formed disc space is designated L5-S1. Alignment:  Normal. Vertebrae: Compression deformity of the L1 vertebral body with up to approximately 30% loss of vertebral body height anteriorly is unchanged since 2023, without marrow edema or bony retropulsion. The other vertebral body heights are preserved. Background marrow signal is normal. There is no suspicious  marrow signal abnormality or marrow edema. Conus medullaris and cauda equina: Conus extends to the L1 level. Conus and cauda equina appear normal. Paraspinal and other soft tissues: Unremarkable. Disc levels: The disc heights are overall preserved with mild desiccation at T11-T12 and L3-L4. T12-L1: Mild right worse than left facet arthropathy without significant spinal canal or neural foraminal stenosis. L1-L2: There is a minimal disc bulge and minimal facet arthropathy without significant spinal canal or neural foraminal stenosis. L2-L3: There is a shallow left foraminal/extraforaminal protrusion without significant spinal canal or neural foraminal stenosis. L3-L4: There is a mild disc bulge, mild bilateral facet  arthropathy, and prominent dorsal epidural fat resulting in mild spinal canal stenosis and no significant neural foraminal stenosis. L4-L5: There is a mild disc bulge, mild bilateral facet arthropathy, and prominent dorsal epidural fat resulting in mild left subarticular zone narrowing without evidence of nerve root impingement, and mild-to-moderate right and mild left neural foraminal stenosis L5-S1: No significant spinal canal or neural foraminal stenosis. IMPRESSION: 1. 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 No acute osseous finding. 2. At L3-L4 there is mild spinal canal narrowing, and at L4-L5 there is mild left subarticular zone narrowing without evidence of impingement and mild-to-moderate right and mild left neural foraminal stenosis due to mild disc bulges, mild facet arthropathy, and prominent dorsal epidural fat. No high-grade spinal canal or neural foraminal stenosis at any level. Electronically Signed   By: Lesia Hausen M.D.   On: 02/09/2023 19:02    Microbiology: Recent Results (from the past 240 hour(s))  MRSA Next Gen by PCR, Nasal     Status: None   Collection Time: 02/10/23  2:48 AM   Specimen: Nasal Mucosa; Nasal Swab  Result Value Ref Range Status    MRSA by PCR Next Gen NOT DETECTED NOT DETECTED Final    Comment: (NOTE) The GeneXpert MRSA Assay (FDA approved for NASAL specimens only), is one component of a comprehensive MRSA colonization surveillance program. It is not intended to diagnose MRSA infection nor to guide or monitor treatment for MRSA infections. Test performance is not FDA approved in patients less than 42 years old. Performed at [facility], [address] [place], [place] [address]      Labs: Basic Metabolic Panel: Recent Labs  Lab 02/09/23 1513 02/09/23 1940 02/10/23 0424 02/10/23 0842 02/10/23 1232 02/10/23 1655 02/11/23 0559 02/11/23 1621 02/12/23 0544  NA 116*   < > 123*   < > 127* 127* 131*  131* 132* 133*  K 2.3*   < > 2.8*  --   --  3.4* 3.6  3.6 4.7 4.7  CL 76*  --  84*  --   --  90* 97*  97* 101 98  CO2 27  --  28  --   --  28 26  26 22 26   GLUCOSE 114*  --  123*  --   --  136* 109*  106* 165* 100*  BUN 12  --  13  --   --  15 12  11 11 12   CREATININE 0.69  --  0.58  --   --  0.56 0.58  0.56 0.66 0.57  CALCIUM 8.9  --  9.1  --   --  9.4 8.9  8.9 8.9 8.8*  MG 1.5*  --  2.3  --   --   --   --   --   --   PHOS  --   --   --   --   --  2.1* 2.5 2.1* 2.2*   < > = values in this interval not displayed.   Liver Function Tests: Recent Labs  Lab 02/09/23 1351 02/09/23 1513 02/10/23 0424 02/10/23 1655 02/11/23 0559 02/11/23 1621 02/12/23 0544  AST 34 26 25  --   --   --   --   ALT 19 18 16   --   --   --   --   ALKPHOS 64 60 54  --   --   --   --   BILITOT 1.0 1.0 0.9  --   --   --   --  PROT 7.5 6.5 6.6  --   --   --   --   ALBUMIN 4.1 3.8 3.5 3.7 3.2* 3.2* 3.0*   No results for input(s): "LIPASE", "AMYLASE" in the last 168 hours. No results for input(s): "AMMONIA" in the last 168 hours. CBC: Recent Labs  Lab 02/09/23 1513 02/10/23 0424  WBC 6.0 5.6  NEUTROABS 3.1  --   HGB 11.6* 12.1  HCT 32.3* 34.2*  MCV 88.0 90.7  PLT 286 300   Cardiac Enzymes: No  results for input(s): "CKTOTAL", "CKMB", "CKMBINDEX", "TROPONINI" in the last 168 hours. BNP: BNP (last 3 results) No results for input(s): "BNP" in the last 8760 hours.  ProBNP (last 3 results) No results for input(s): "PROBNP" in the last 8760 hours.  CBG: Recent Labs  Lab 02/11/23 0815  GLUCAP 87    Signed:  Rhetta Mura MD   [facility] 02/12/2023, 3:33 PM

## 2023-02-13 ENCOUNTER — Other Ambulatory Visit (HOSPITAL_COMMUNITY): Payer: Self-pay

## 2023-02-14 NOTE — Progress Notes (Deleted)
Cardiology Office Note    Date:  02/14/2023  ID:  [name], DOB 08-09-1971, MRN [id number] Cardiologist: Nona Dell, MD    History of Present Illness:    [name] is a 52 y.o. female with past medical history of HTN and prior presumed NICM (EF at 45-50% in 2011 and felt possibly secondary to viral illness, normalized by repeat imaging in 06/2020) who presents to the office today for overdue follow-up.  She was last examined by myself in 08/2021 and denied any recent anginal symptoms at that time. She was continued on her current cardiac medications including Valsartan-HCTZ 320-25 mg daily. She did develop facial swelling in 01/2022 which was felt to possibly be secondary to Valsartan and this was discontinued with her being started on Amlodipine.  She called the office again in 05/2022 reporting her PCP had reduced her HCTZ to 12.5 mg daily and BP was remaining elevated. Therefore, she was started on Coreg. She did have scheduled follow-up visits earlier this year but canceled or no showed for the appointments.  She was most recently admitted to [facility] from 6/7 - 02/12/2023 for evaluation of right hip pain which was felt to possibly be secondary to sciatica. During workup, she was found to have significant hyponatremia with Na+ at 116 on admission. She had been experiencing nausea and vomiting. HCTZ was discontinued and she received IV fluids during admission with improvement in her sodium as this had improved to 133 at the time of discharge. It appears Amlodipine 5 mg daily was also stopped at discharge and she was continued on Coreg 12.5 mg twice daily.  ROS: ***  Studies Reviewed:   EKG: EKG is*** ordered today and demonstrates ***  Echocardiogram: 06/2020 IMPRESSIONS     1. Left ventricular ejection fraction, by estimation, is 65 to 70%. The  left ventricle has normal function. The left ventricle has no regional  wall motion abnormalities. Left ventricular  diastolic parameters are  indeterminate.   2. Right ventricular systolic function is normal. The right ventricular  size is normal.   3. The mitral valve is normal in structure. No evidence of mitral valve  regurgitation. No evidence of mitral stenosis.   4. The aortic valve is tricuspid. Aortic valve regurgitation is not  visualized. No aortic stenosis is present.   5. The inferior vena cava is normal in size with greater than 50%  respiratory variability, suggesting right atrial pressure of 3 mmHg.    Risk Assessment/Calculations:   {Does this patient have ATRIAL FIBRILLATION?:[phone]} No BP recorded.  {Refresh Note OR Click here to enter BP  :1}***         Physical Exam:   VS:  LMP 02/09/2012    Wt Readings from Last 3 Encounters:  02/12/23 172 lb 9.9 oz (78.3 kg)  02/06/23 165 lb (74.8 kg)  02/08/22 175 lb (79.4 kg)     GEN: Well nourished, well developed in no acute distress NECK: No JVD; No carotid bruits CARDIAC: ***RRR, no murmurs, rubs, gallops RESPIRATORY:  Clear to auscultation without rales, wheezing or rhonchi  ABDOMEN: Appears non-distended. No obvious abdominal masses. EXTREMITIES: No clubbing or cyanosis. No edema.  Distal pedal pulses are 2+ bilaterally.   Assessment and Plan:   1. HTN - ***  2. History of Cardiomyopathy - Her EF was at 45-50% in 2011 and felt possibly secondary to a viral illness. Has normalized by repeat imaging in the interim.   3. Hyponatremia/Hypokalemia - ***  Signed, Ellsworth Lennox, PA-C

## 2023-02-15 ENCOUNTER — Ambulatory Visit: Payer: MEDICAID | Attending: Student | Admitting: Student

## 2023-02-15 ENCOUNTER — Encounter: Payer: Self-pay | Admitting: Student

## 2023-02-21 ENCOUNTER — Other Ambulatory Visit: Payer: Self-pay | Admitting: Cardiology

## 2023-07-23 DIAGNOSIS — L409 Psoriasis, unspecified: Secondary | ICD-10-CM | POA: Diagnosis not present

## 2023-07-23 DIAGNOSIS — Z79899 Other long term (current) drug therapy: Secondary | ICD-10-CM | POA: Diagnosis not present

## 2023-07-31 DIAGNOSIS — Z6831 Body mass index (BMI) 31.0-31.9, adult: Secondary | ICD-10-CM | POA: Diagnosis not present

## 2023-07-31 DIAGNOSIS — L409 Psoriasis, unspecified: Secondary | ICD-10-CM | POA: Diagnosis not present

## 2023-07-31 DIAGNOSIS — Z79899 Other long term (current) drug therapy: Secondary | ICD-10-CM | POA: Diagnosis not present

## 2023-07-31 DIAGNOSIS — M5440 Lumbago with sciatica, unspecified side: Secondary | ICD-10-CM | POA: Diagnosis not present

## 2023-08-03 DIAGNOSIS — M2560 Stiffness of unspecified joint, not elsewhere classified: Secondary | ICD-10-CM | POA: Diagnosis not present

## 2023-08-03 DIAGNOSIS — M6281 Muscle weakness (generalized): Secondary | ICD-10-CM | POA: Diagnosis not present

## 2023-08-03 DIAGNOSIS — M549 Dorsalgia, unspecified: Secondary | ICD-10-CM | POA: Diagnosis not present

## 2023-08-17 DIAGNOSIS — M549 Dorsalgia, unspecified: Secondary | ICD-10-CM | POA: Diagnosis not present

## 2023-08-17 DIAGNOSIS — M6281 Muscle weakness (generalized): Secondary | ICD-10-CM | POA: Diagnosis not present

## 2023-08-17 DIAGNOSIS — M2560 Stiffness of unspecified joint, not elsewhere classified: Secondary | ICD-10-CM | POA: Diagnosis not present

## 2023-08-23 DIAGNOSIS — M6281 Muscle weakness (generalized): Secondary | ICD-10-CM | POA: Diagnosis not present

## 2023-08-23 DIAGNOSIS — M2560 Stiffness of unspecified joint, not elsewhere classified: Secondary | ICD-10-CM | POA: Diagnosis not present

## 2023-08-23 DIAGNOSIS — M549 Dorsalgia, unspecified: Secondary | ICD-10-CM | POA: Diagnosis not present

## 2023-10-23 DIAGNOSIS — M2559 Pain in other specified joint: Secondary | ICD-10-CM | POA: Diagnosis not present

## 2023-10-23 DIAGNOSIS — M545 Low back pain, unspecified: Secondary | ICD-10-CM | POA: Diagnosis not present

## 2023-10-23 DIAGNOSIS — M797 Fibromyalgia: Secondary | ICD-10-CM | POA: Diagnosis not present

## 2023-10-23 DIAGNOSIS — L409 Psoriasis, unspecified: Secondary | ICD-10-CM | POA: Diagnosis not present

## 2023-10-30 DIAGNOSIS — M797 Fibromyalgia: Secondary | ICD-10-CM | POA: Diagnosis not present

## 2023-10-30 DIAGNOSIS — R059 Cough, unspecified: Secondary | ICD-10-CM | POA: Diagnosis not present

## 2023-10-30 DIAGNOSIS — I10 Essential (primary) hypertension: Secondary | ICD-10-CM | POA: Diagnosis not present

## 2023-10-30 DIAGNOSIS — R7309 Other abnormal glucose: Secondary | ICD-10-CM | POA: Diagnosis not present

## 2023-10-30 DIAGNOSIS — E538 Deficiency of other specified B group vitamins: Secondary | ICD-10-CM | POA: Diagnosis not present

## 2023-10-30 DIAGNOSIS — E559 Vitamin D deficiency, unspecified: Secondary | ICD-10-CM | POA: Diagnosis not present

## 2023-10-30 DIAGNOSIS — E785 Hyperlipidemia, unspecified: Secondary | ICD-10-CM | POA: Diagnosis not present

## 2023-10-30 DIAGNOSIS — J988 Other specified respiratory disorders: Secondary | ICD-10-CM | POA: Diagnosis not present

## 2023-11-13 DIAGNOSIS — Z01419 Encounter for gynecological examination (general) (routine) without abnormal findings: Secondary | ICD-10-CM | POA: Diagnosis not present

## 2023-11-13 DIAGNOSIS — Z1231 Encounter for screening mammogram for malignant neoplasm of breast: Secondary | ICD-10-CM | POA: Diagnosis not present

## 2023-11-21 ENCOUNTER — Other Ambulatory Visit: Payer: Self-pay | Admitting: Cardiology

## 2023-12-04 DIAGNOSIS — M5489 Other dorsalgia: Secondary | ICD-10-CM | POA: Diagnosis not present

## 2023-12-04 DIAGNOSIS — M5440 Lumbago with sciatica, unspecified side: Secondary | ICD-10-CM | POA: Diagnosis not present

## 2023-12-04 DIAGNOSIS — Z6831 Body mass index (BMI) 31.0-31.9, adult: Secondary | ICD-10-CM | POA: Diagnosis not present

## 2023-12-18 DIAGNOSIS — R051 Acute cough: Secondary | ICD-10-CM | POA: Diagnosis not present

## 2023-12-21 DIAGNOSIS — M5489 Other dorsalgia: Secondary | ICD-10-CM | POA: Diagnosis not present

## 2023-12-25 DIAGNOSIS — M7061 Trochanteric bursitis, right hip: Secondary | ICD-10-CM | POA: Diagnosis not present

## 2023-12-25 DIAGNOSIS — M5489 Other dorsalgia: Secondary | ICD-10-CM | POA: Diagnosis not present

## 2023-12-25 DIAGNOSIS — M7062 Trochanteric bursitis, left hip: Secondary | ICD-10-CM | POA: Diagnosis not present

## 2023-12-27 DIAGNOSIS — N950 Postmenopausal bleeding: Secondary | ICD-10-CM | POA: Diagnosis not present

## 2024-01-01 DIAGNOSIS — N950 Postmenopausal bleeding: Secondary | ICD-10-CM | POA: Diagnosis not present

## 2024-01-08 DIAGNOSIS — M5489 Other dorsalgia: Secondary | ICD-10-CM | POA: Diagnosis not present

## 2024-01-11 DIAGNOSIS — I10 Essential (primary) hypertension: Secondary | ICD-10-CM | POA: Diagnosis not present

## 2024-02-01 DIAGNOSIS — M5489 Other dorsalgia: Secondary | ICD-10-CM | POA: Diagnosis not present

## 2024-02-19 DIAGNOSIS — M5489 Other dorsalgia: Secondary | ICD-10-CM | POA: Diagnosis not present

## 2024-02-19 DIAGNOSIS — S73192A Other sprain of left hip, initial encounter: Secondary | ICD-10-CM | POA: Diagnosis not present

## 2024-02-21 DIAGNOSIS — M5489 Other dorsalgia: Secondary | ICD-10-CM | POA: Diagnosis not present

## 2024-03-04 DIAGNOSIS — M5440 Lumbago with sciatica, unspecified side: Secondary | ICD-10-CM | POA: Diagnosis not present

## 2024-03-04 DIAGNOSIS — Z79899 Other long term (current) drug therapy: Secondary | ICD-10-CM | POA: Diagnosis not present

## 2024-03-04 DIAGNOSIS — Z6829 Body mass index (BMI) 29.0-29.9, adult: Secondary | ICD-10-CM | POA: Diagnosis not present

## 2024-03-04 DIAGNOSIS — L409 Psoriasis, unspecified: Secondary | ICD-10-CM | POA: Diagnosis not present

## 2024-03-11 DIAGNOSIS — M25552 Pain in left hip: Secondary | ICD-10-CM | POA: Diagnosis not present

## 2024-03-14 DIAGNOSIS — E871 Hypo-osmolality and hyponatremia: Secondary | ICD-10-CM | POA: Diagnosis not present

## 2024-04-28 DIAGNOSIS — S73192D Other sprain of left hip, subsequent encounter: Secondary | ICD-10-CM | POA: Diagnosis not present

## 2024-05-20 DIAGNOSIS — R5383 Other fatigue: Secondary | ICD-10-CM | POA: Diagnosis not present

## 2024-05-20 DIAGNOSIS — I10 Essential (primary) hypertension: Secondary | ICD-10-CM | POA: Diagnosis not present

## 2024-06-11 DIAGNOSIS — M7138 Other bursal cyst, other site: Secondary | ICD-10-CM | POA: Diagnosis not present

## 2024-06-11 DIAGNOSIS — M5116 Intervertebral disc disorders with radiculopathy, lumbar region: Secondary | ICD-10-CM | POA: Diagnosis not present

## 2024-06-11 DIAGNOSIS — M48061 Spinal stenosis, lumbar region without neurogenic claudication: Secondary | ICD-10-CM | POA: Diagnosis not present

## 2024-06-11 DIAGNOSIS — M5441 Lumbago with sciatica, right side: Secondary | ICD-10-CM | POA: Diagnosis not present

## 2024-06-11 DIAGNOSIS — M5416 Radiculopathy, lumbar region: Secondary | ICD-10-CM | POA: Diagnosis not present

## 2024-07-29 DIAGNOSIS — M25552 Pain in left hip: Secondary | ICD-10-CM | POA: Diagnosis not present

## 2024-08-22 DIAGNOSIS — G4730 Sleep apnea, unspecified: Secondary | ICD-10-CM | POA: Diagnosis not present

## 2024-08-26 DIAGNOSIS — M25552 Pain in left hip: Secondary | ICD-10-CM | POA: Diagnosis not present

## 2024-09-02 DIAGNOSIS — L409 Psoriasis, unspecified: Secondary | ICD-10-CM | POA: Diagnosis not present

## 2024-09-02 DIAGNOSIS — M545 Low back pain, unspecified: Secondary | ICD-10-CM | POA: Diagnosis not present

## 2024-09-02 DIAGNOSIS — M2559 Pain in other specified joint: Secondary | ICD-10-CM | POA: Diagnosis not present

## 2024-09-02 DIAGNOSIS — M797 Fibromyalgia: Secondary | ICD-10-CM | POA: Diagnosis not present
